# Patient Record
Sex: Female | Born: 1995 | Race: White | Hispanic: No | Marital: Single | Smoking: Former smoker
Health system: Southern US, Community
[De-identification: ages and names within clinical notes are randomized; demographics above are authoritative.]

## PROBLEM LIST (undated history)

## (undated) ENCOUNTER — Inpatient Hospital Stay (HOSPITAL_COMMUNITY): Payer: Self-pay

## (undated) DIAGNOSIS — G43909 Migraine, unspecified, not intractable, without status migrainosus: Secondary | ICD-10-CM

## (undated) DIAGNOSIS — D242 Benign neoplasm of left breast: Secondary | ICD-10-CM

## (undated) HISTORY — PX: OTHER SURGICAL HISTORY: SHX169

## (undated) HISTORY — PX: KNEE ARTHROSCOPY W/ ACL RECONSTRUCTION: SHX1858

---
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
## 2017-03-01 DIAGNOSIS — D242 Benign neoplasm of left breast: Secondary | ICD-10-CM

## 2017-03-01 HISTORY — DX: Benign neoplasm of left breast: D24.2

## 2017-03-21 ENCOUNTER — Other Ambulatory Visit: Payer: Self-pay | Admitting: General Surgery

## 2017-03-21 DIAGNOSIS — D242 Benign neoplasm of left breast: Secondary | ICD-10-CM | POA: Diagnosis not present

## 2017-03-28 ENCOUNTER — Encounter (HOSPITAL_BASED_OUTPATIENT_CLINIC_OR_DEPARTMENT_OTHER): Payer: Self-pay | Admitting: *Deleted

## 2017-03-28 ENCOUNTER — Other Ambulatory Visit: Payer: Self-pay

## 2017-04-03 NOTE — H&P (Signed)
Abigail Salas Location: [facility] Patient #: [id number] DOB: October 10, 1995 Single / Language: Cleophus Molt / Race: White Female        History of Present Illness  The patient is a 22 year old female who presents with a breast mass. This is a 22 year old female, referred by Linda Hedges, D[facility] and Nolon Nations at the [facility] for evaluation and management of a symptomatic fibroadenoma of the left breast. Her grandmother is with her throughout the encounter. She has noticed a palpable mass in her left breast one year. It waxes and wanes in size with her menstrual period. There is some tenderness. Ultrasound shows a 2.6 cm mass in the left breast upper inner quadrant, 11 o'clock position, 6 cm from the nipple consistent with a fibroadenoma. The patient desires excision. She does not want to be followed with serial ultrasounds.  Family history is significant for breast cancer in a maternal aunt, survivor. No other breast or ovarian cancer in the family Past history is negative. She is healthy. BMI 35 Social history she lives with her grandmother. 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 Denies alcohol or tobacco. Went to [employer]  She will be scheduled for excision of left breast fibroadenoma under general anesthesia with LMA in the near future. I discussed indications, details, techniques, and risk of the surgery with them both. She is aware the risks of bleeding, infection, cosmetic deformity, nerve damage with chronic pain or numbness. She knows there will be a scar. Rarely R was surprised to find cancer but she is aware that that is a tiny possibility. She understands all of these issues well. All of her questions were answered. She agrees with this plan   Allergies No Known Drug Allergies  Allergies Reconciled   Medication History  Skyla (13.5MG  IUD, Intrauterine) Active. Medications  Reconciled  Vitals  Weight: 181 lb (95th percentile) Height: 60in (5th percentile) Body Surface Area: 1.79 m Body Mass Index: 35.35 kg/m  (97th percentile)  Temp.: 98.30F  Pulse: 74 (Regular)  BP: 128/82 (Sitting, Left Arm, Standard)  Percentiles calculated using CDC data for children 2-20 years.     Physical Exam  General Mental Status-Alert. General Appearance-Consistent with stated age. Hydration-Well hydrated. Voice-Normal. Note: BMI 35   Head and Neck Head-normocephalic, atraumatic with no lesions or palpable masses. Trachea-midline. Thyroid Gland Characteristics - normal size and consistency.  Eye Eyeball - Bilateral-Extraocular movements intact. Sclera/Conjunctiva - Bilateral-No scleral icterus.  Chest and Lung Exam Chest and lung exam reveals -quiet, even and easy respiratory effort with no use of accessory muscles and on auscultation, normal breath sounds, no adventitious sounds and normal vocal resonance. Inspection Chest Wall - Normal. Back - normal.  Breast Note: Breast are large. Soft. No skin changes on either side. Palpable rubbery mobile slightly tender mass left breast, 11 o'clock position. About 5 cm outside the areolar margin. Texture very consistent with fibroadenoma. No other masses. No axillary adenopathy on either side.   Cardiovascular Cardiovascular examination reveals -normal heart sounds, regular rate and rhythm with no murmurs and normal pedal pulses bilaterally.  Abdomen Inspection Inspection of the abdomen reveals - No Hernias. Skin - Scar - no surgical scars. Palpation/Percussion Palpation and Percussion of the abdomen reveal - Soft, Non Tender, No Rebound tenderness, No Rigidity (guarding) and No hepatosplenomegaly. Auscultation Auscultation of the abdomen reveals - Bowel sounds normal.  Neurologic Neurologic evaluation reveals -alert and oriented x 3 with no  impairment of recent or  remote memory. Mental Status-Normal.  Musculoskeletal Normal Exam - Left-Upper Extremity Strength Normal and Lower Extremity Strength Normal. Normal Exam - Right-Upper Extremity Strength Normal and Lower Extremity Strength Normal.  Lymphatic Head & Neck  General Head & Neck Lymphatics: Bilateral - Description - Normal. Axillary  General Axillary Region: Bilateral - Description - Normal. Tenderness - Non Tender. Femoral & Inguinal  Generalized Femoral & Inguinal Lymphatics: Bilateral - Description - Normal. Tenderness - Non Tender.    Assessment & Plan  BREAST FIBROADENOMA, LEFT (D24.2)    You have a 2.6 cm mass in your left breast, upper inner quadrant Almost certainly this is a benign fibroadenoma you stated that you would like this removed which is reasonable considering its size and the pain you're having you'll be scheduled for excision of left breast fibroadenoma in the operating room in the near future We have discussed the indications, techniques, and risks of the surgery in detail  Pt Education - Pamphlet Given - Breast Biopsy: discussed with patient and provided information. BMI 35.0-35.9,ADULT (Z68.35)    Edsel Petrin. Dalbert Batman, M.D., [facility] General and Minimally invasive Surgery Breast and Colorectal Surgery Office:   [phone] Pager:   [phone]

## 2017-04-04 ENCOUNTER — Other Ambulatory Visit: Payer: Self-pay

## 2017-04-04 ENCOUNTER — Ambulatory Visit (HOSPITAL_BASED_OUTPATIENT_CLINIC_OR_DEPARTMENT_OTHER)
Admission: RE | Admit: 2017-04-04 | Discharge: 2017-04-04 | Disposition: A | Discharge status: Home / Self Care | Payer: Commercial Managed Care - HMO | Source: Ambulatory Visit | Attending: General Surgery | Admitting: General Surgery

## 2017-04-04 ENCOUNTER — Encounter (HOSPITAL_BASED_OUTPATIENT_CLINIC_OR_DEPARTMENT_OTHER): Payer: Self-pay

## 2017-04-04 ENCOUNTER — Ambulatory Visit (HOSPITAL_BASED_OUTPATIENT_CLINIC_OR_DEPARTMENT_OTHER): Payer: Commercial Managed Care - HMO | Admitting: Anesthesiology

## 2017-04-04 ENCOUNTER — Encounter (HOSPITAL_BASED_OUTPATIENT_CLINIC_OR_DEPARTMENT_OTHER)
Admission: RE | Disposition: A | Discharge status: Home / Self Care | Payer: Self-pay | Source: Ambulatory Visit | Attending: General Surgery

## 2017-04-04 DIAGNOSIS — Z803 Family history of malignant neoplasm of breast: Secondary | ICD-10-CM | POA: Insufficient documentation

## 2017-04-04 DIAGNOSIS — D242 Benign neoplasm of left breast: Secondary | ICD-10-CM | POA: Diagnosis not present

## 2017-04-04 HISTORY — DX: Migraine, unspecified, not intractable, without status migrainosus: G43.909

## 2017-04-04 HISTORY — DX: Benign neoplasm of left breast: D24.2

## 2017-04-04 HISTORY — PX: BREAST LUMPECTOMY: SHX2

## 2017-04-04 HISTORY — PX: BREAST EXCISIONAL BIOPSY: SUR124

## 2017-04-04 SURGERY — BREAST LUMPECTOMY
Anesthesia: General | Site: Breast | Laterality: Left

## 2017-04-04 MED ORDER — SCOPOLAMINE 1 MG/3DAYS TD PT72
1.0000 | MEDICATED_PATCH | Freq: Once | TRANSDERMAL | Status: DC | PRN
Start: 2017-04-04 — End: 2017-04-04

## 2017-04-04 MED ORDER — CEFAZOLIN SODIUM-DEXTROSE 2-4 GM/100ML-% IV SOLN
INTRAVENOUS | Status: AC
  Filled 2017-04-04: qty 100

## 2017-04-04 MED ORDER — LIDOCAINE 2% (20 MG/ML) 5 ML SYRINGE
INTRAMUSCULAR | Status: AC
  Filled 2017-04-04: qty 5

## 2017-04-04 MED ORDER — ONDANSETRON HCL 4 MG/2ML IJ SOLN
INTRAMUSCULAR | Status: AC
  Filled 2017-04-04: qty 2

## 2017-04-04 MED ORDER — FENTANYL CITRATE (PF) 100 MCG/2ML IJ SOLN
50.0000 ug | INTRAMUSCULAR | Status: DC | PRN
  Administered 2017-04-04: 100 ug via INTRAVENOUS

## 2017-04-04 MED ORDER — GABAPENTIN 300 MG PO CAPS
300.0000 mg | ORAL_CAPSULE | ORAL | Status: AC
  Administered 2017-04-04: 300 mg via ORAL

## 2017-04-04 MED ORDER — LACTATED RINGERS IV SOLN
INTRAVENOUS | Status: DC
  Administered 2017-04-04: 07:00:00 via INTRAVENOUS

## 2017-04-04 MED ORDER — CELECOXIB 200 MG PO CAPS
200.0000 mg | ORAL_CAPSULE | ORAL | Status: AC
  Administered 2017-04-04: 200 mg via ORAL

## 2017-04-04 MED ORDER — OXYCODONE HCL 5 MG/5ML PO SOLN: 5.0000 mg | Freq: Once | ORAL | Status: DC | PRN

## 2017-04-04 MED ORDER — DEXAMETHASONE SODIUM PHOSPHATE 4 MG/ML IJ SOLN
INTRAMUSCULAR | Status: DC | PRN
  Administered 2017-04-04: 10 mg via INTRAVENOUS

## 2017-04-04 MED ORDER — ONDANSETRON HCL 4 MG/2ML IJ SOLN: 4.0000 mg | Freq: Four times a day (QID) | INTRAMUSCULAR | Status: DC | PRN

## 2017-04-04 MED ORDER — CEFAZOLIN SODIUM-DEXTROSE 2-4 GM/100ML-% IV SOLN
2.0000 g | INTRAVENOUS | Status: AC
  Administered 2017-04-04: 2 g via INTRAVENOUS

## 2017-04-04 MED ORDER — BUPIVACAINE-EPINEPHRINE (PF) 0.5% -1:200000 IJ SOLN
INTRAMUSCULAR | Status: AC
Start: 2017-04-04 — End: 2017-04-04
  Filled 2017-04-04: qty 30

## 2017-04-04 MED ORDER — BUPIVACAINE-EPINEPHRINE 0.5% -1:200000 IJ SOLN
INTRAMUSCULAR | Status: DC | PRN
  Administered 2017-04-04: 10 mL

## 2017-04-04 MED ORDER — ACETAMINOPHEN 500 MG PO TABS
ORAL_TABLET | ORAL | Status: AC
Start: 2017-04-04 — End: 2017-04-04
  Filled 2017-04-04: qty 2

## 2017-04-04 MED ORDER — GABAPENTIN 300 MG PO CAPS
ORAL_CAPSULE | ORAL | Status: AC
  Filled 2017-04-04: qty 1

## 2017-04-04 MED ORDER — PROPOFOL 500 MG/50ML IV EMUL
INTRAVENOUS | Status: AC
  Filled 2017-04-04: qty 50

## 2017-04-04 MED ORDER — CHLORHEXIDINE GLUCONATE CLOTH 2 % EX PADS: 6.0000 | MEDICATED_PAD | Freq: Once | CUTANEOUS | Status: DC

## 2017-04-04 MED ORDER — ACETAMINOPHEN 500 MG PO TABS
1000.0000 mg | ORAL_TABLET | ORAL | Status: AC
  Administered 2017-04-04: 1000 mg via ORAL

## 2017-04-04 MED ORDER — PROPOFOL 10 MG/ML IV BOLUS
INTRAVENOUS | Status: DC | PRN
  Administered 2017-04-04: 30 mg via INTRAVENOUS
  Administered 2017-04-04: 200 mg via INTRAVENOUS
  Administered 2017-04-04: 20 mg via INTRAVENOUS

## 2017-04-04 MED ORDER — CELECOXIB 200 MG PO CAPS
ORAL_CAPSULE | ORAL | Status: AC
  Filled 2017-04-04: qty 1

## 2017-04-04 MED ORDER — LIDOCAINE HCL (CARDIAC) 20 MG/ML IV SOLN
INTRAVENOUS | Status: DC | PRN
  Administered 2017-04-04: 80 mg via INTRAVENOUS

## 2017-04-04 MED ORDER — FENTANYL CITRATE (PF) 100 MCG/2ML IJ SOLN
INTRAMUSCULAR | Status: AC
  Filled 2017-04-04: qty 2

## 2017-04-04 MED ORDER — MIDAZOLAM HCL 2 MG/2ML IJ SOLN
1.0000 mg | INTRAMUSCULAR | Status: DC | PRN
  Administered 2017-04-04: 2 mg via INTRAVENOUS

## 2017-04-04 MED ORDER — MIDAZOLAM HCL 2 MG/2ML IJ SOLN
INTRAMUSCULAR | Status: AC
  Filled 2017-04-04: qty 2

## 2017-04-04 MED ORDER — FENTANYL CITRATE (PF) 100 MCG/2ML IJ SOLN
25.0000 ug | INTRAMUSCULAR | Status: DC | PRN
  Administered 2017-04-04 (×2): 50 ug via INTRAVENOUS

## 2017-04-04 MED ORDER — OXYCODONE HCL 5 MG PO TABS: 5.0000 mg | ORAL_TABLET | Freq: Once | ORAL | Status: DC | PRN

## 2017-04-04 MED ORDER — DEXAMETHASONE SODIUM PHOSPHATE 10 MG/ML IJ SOLN
INTRAMUSCULAR | Status: AC
  Filled 2017-04-04: qty 1

## 2017-04-04 MED ORDER — ONDANSETRON HCL 4 MG/2ML IJ SOLN
INTRAMUSCULAR | Status: DC | PRN
  Administered 2017-04-04: 4 mg via INTRAVENOUS

## 2017-04-04 MED ORDER — HYDROCODONE-ACETAMINOPHEN 5-325 MG PO TABS: 1.0000 | ORAL_TABLET | Freq: Four times a day (QID) | ORAL | 0 refills | Status: DC | PRN

## 2017-04-04 SURGICAL SUPPLY — 39 items
BINDER BREAST XLRG (GAUZE/BANDAGES/DRESSINGS) ×2 IMPLANT
BLADE HEX COATED 2.75 (ELECTRODE) ×2 IMPLANT
BLADE SURG 15 STRL LF DISP TIS (BLADE) ×1 IMPLANT
BLADE SURG 15 STRL SS (BLADE) ×1
CANISTER SUCT 1200ML W/VALVE (MISCELLANEOUS) ×2 IMPLANT
CHLORAPREP W/TINT 26ML (MISCELLANEOUS) ×2 IMPLANT
COVER BACK TABLE 60X90IN (DRAPES) ×2 IMPLANT
COVER MAYO STAND STRL (DRAPES) ×2 IMPLANT
DERMABOND ADVANCED (GAUZE/BANDAGES/DRESSINGS) ×1
DERMABOND ADVANCED .7 DNX12 (GAUZE/BANDAGES/DRESSINGS) ×1 IMPLANT
DRAPE LAPAROTOMY 100X72 PEDS (DRAPES) ×2 IMPLANT
DRAPE UTILITY XL STRL (DRAPES) ×2 IMPLANT
DRSG PAD ABDOMINAL 8X10 ST (GAUZE/BANDAGES/DRESSINGS) ×2 IMPLANT
ELECT REM PT RETURN 9FT ADLT (ELECTROSURGICAL) ×2
ELECTRODE REM PT RTRN 9FT ADLT (ELECTROSURGICAL) ×1 IMPLANT
GAUZE SPONGE 4X4 12PLY STRL LF (GAUZE/BANDAGES/DRESSINGS) ×2 IMPLANT
GAUZE SPONGE 4X4 16PLY XRAY LF (GAUZE/BANDAGES/DRESSINGS) ×4 IMPLANT
GLOVE BIO SURGEON STRL SZ7 (GLOVE) ×2 IMPLANT
GLOVE BIOGEL PI IND STRL 7.0 (GLOVE) ×1 IMPLANT
GLOVE BIOGEL PI INDICATOR 7.0 (GLOVE) ×1
GLOVE EUDERMIC 7 POWDERFREE (GLOVE) ×2 IMPLANT
GOWN STRL REUS W/ TWL LRG LVL3 (GOWN DISPOSABLE) ×1 IMPLANT
GOWN STRL REUS W/ TWL XL LVL3 (GOWN DISPOSABLE) ×1 IMPLANT
GOWN STRL REUS W/TWL LRG LVL3 (GOWN DISPOSABLE) ×1
GOWN STRL REUS W/TWL XL LVL3 (GOWN DISPOSABLE) ×1
NEEDLE HYPO 25X1 1.5 SAFETY (NEEDLE) ×2 IMPLANT
NS IRRIG 1000ML POUR BTL (IV SOLUTION) ×2 IMPLANT
PACK BASIN DAY SURGERY FS (CUSTOM PROCEDURE TRAY) ×2 IMPLANT
PENCIL BUTTON HOLSTER BLD 10FT (ELECTRODE) ×2 IMPLANT
SLEEVE SCD COMPRESS KNEE MED (MISCELLANEOUS) ×2 IMPLANT
SPONGE LAP 4X18 X RAY DECT (DISPOSABLE) ×2 IMPLANT
SUT MNCRL AB 4-0 PS2 18 (SUTURE) ×2 IMPLANT
SUT SILK 2 0 SH (SUTURE) ×2 IMPLANT
SUT VICRYL 3-0 CR8 SH (SUTURE) ×2 IMPLANT
SYR 10ML LL (SYRINGE) ×2 IMPLANT
SYR BULB 3OZ (MISCELLANEOUS) ×2 IMPLANT
TOWEL OR NON WOVEN STRL DISP B (DISPOSABLE) ×2 IMPLANT
TUBE CONNECTING 20X1/4 (TUBING) ×2 IMPLANT
YANKAUER SUCT BULB TIP NO VENT (SUCTIONS) ×2 IMPLANT

## 2017-04-04 NOTE — Anesthesia Procedure Notes (Signed)
Procedure Name: LMA Insertion Date/Time: 04/04/2017 7:31 AM Performed by: Lyndee Leo, CRNA Pre-anesthesia Checklist: Patient identified, Emergency Drugs available, Suction available and Patient being monitored Patient Re-evaluated:Patient Re-evaluated prior to induction Oxygen Delivery Method: Circle system utilized Preoxygenation: Pre-oxygenation with 100% oxygen Induction Type: IV induction Ventilation: Mask ventilation without difficulty LMA: LMA inserted LMA Size: 4.0 Number of attempts: 1 Airway Equipment and Method: Bite block Placement Confirmation: positive ETCO2 Tube secured with: Tape Dental Injury: Teeth and Oropharynx as per pre-operative assessment

## 2017-04-04 NOTE — Interval H&P Note (Signed)
History and Physical Interval Note:  04/04/2017 6:16 AM  Abigail Salas  has presented today for surgery, with the diagnosis of LEFT BREAST FIBROADENOMA  The various methods of treatment have been discussed with the patient and family. After consideration of risks, benefits and other options for treatment, the patient has consented to  Procedure(s): EXCISION OF LEFT BREAST 2-6CM FIBROADENOMA (Left) as a surgical intervention .  The patient's history has been reviewed, patient examined, no change in status, stable for surgery.  I have reviewed the patient's chart and labs.  Questions were answered to the patient's satisfaction.     Adin Hector

## 2017-04-04 NOTE — Transfer of Care (Signed)
Immediate Anesthesia Transfer of Care Note  Patient: Abigail Salas  Procedure(s) Performed: EXCISION OF LEFT BREAST FIBROADENOMA (Left Breast)  Patient Location: PACU  Anesthesia Type:General  Level of Consciousness: awake, sedated and patient cooperative  Airway & Oxygen Therapy: Patient Spontanous Breathing and Patient connected to face mask oxygen  Post-op Assessment: Report given to RN and Post -op Vital signs reviewed and stable  Post vital signs: Reviewed and stable  Last Vitals:  Vitals:   04/04/17 0624  BP: 109/69  Pulse: (!) 59  Resp: 16  Temp: 36.6 C  SpO2: 99%    Last Pain:  Vitals:   04/04/17 0624  TempSrc: Oral         Complications: No apparent anesthesia complications

## 2017-04-04 NOTE — Anesthesia Preprocedure Evaluation (Signed)
Anesthesia Evaluation  Patient identified by MRN, date of birth, ID band Patient awake    Reviewed: Allergy & Precautions, H&P , NPO status , Patient's Chart, lab work & pertinent test results  Airway Mallampati: II   Neck ROM: full    Dental   Pulmonary neg pulmonary ROS,    breath sounds clear to auscultation       Cardiovascular negative cardio ROS   Rhythm:regular Rate:Normal     Neuro/Psych  Headaches,    GI/Hepatic   Endo/Other  obese  Renal/GU      Musculoskeletal   Abdominal   Peds  Hematology   Anesthesia Other Findings   Reproductive/Obstetrics                             Anesthesia Physical Anesthesia Plan  ASA: II  Anesthesia Plan: General   Post-op Pain Management:    Induction: Intravenous  PONV Risk Score and Plan: 3 and Ondansetron, Dexamethasone, Midazolam and Treatment may vary due to age or medical condition  Airway Management Planned: LMA  Additional Equipment:   Intra-op Plan:   Post-operative Plan:   Informed Consent: I have reviewed the patients History and Physical, chart, labs and discussed the procedure including the risks, benefits and alternatives for the proposed anesthesia with the patient or authorized representative who has indicated his/her understanding and acceptance.     Plan Discussed with: CRNA, Anesthesiologist and Surgeon  Anesthesia Plan Comments:         Anesthesia Quick Evaluation

## 2017-04-04 NOTE — Op Note (Signed)
  Patient Name:           Abigail Salas   Date of Surgery:        04/04/2017  Pre op Diagnosis:      Fibroadenoma left breast  Post op Diagnosis:    Same  Procedure:                 Excision fibroadenoma left breast  Surgeon:                     Edsel Petrin. Dalbert Batman, M.D., FACS  Assistant:                      None  Operative Indications:    This is a 22 year old female, referred by [facility] and Nolon Nations at the [facility] for evaluation and management of a symptomatic fibroadenoma of the left breast.  She has noticed a palpable mass in her left breast one year. It waxes and wanes in size with her menstrual period. There is some tenderness. Ultrasound shows a 2.6 cm mass in the left breast upper inner quadrant, 11 o'clock position, 6 cm from the nipple consistent with a fibroadenoma. The patient desires excision. She does not want to be followed with serial ultrasounds. Family history is significant for breast cancer in a maternal aunt, survivor. No other breast or ovarian cancer in the family She will be scheduled for excision of left breast fibroadenoma under general anesthesia with LMA in the near future.. She agrees with this plan    Operative Findings:       In the left breast, 11:00 position, about 6 cm above the areolar margin there was a 2.5 cm whitish, rubbery, well marginated, slightly lobulated mass consistent with a fibroadenoma.  This was completely excised.  Procedure in Detail:          Following the induction of general LMA anesthesia the patient's left breast was prepped and draped in a sterile fashion.  Surgical timeout was performed.  Intravenous antibiotics were given.  0.5% Marcaine with epinephrine was used as local infiltration anesthetic.  A small curvilinear incision was made overlying the palpable mass.  Using electrocautery and sharp dissection the mass was completely excised and then sent to pathology.  Hemostasis was very good  achieved with electrocautery.  The wound was irrigated.  The breast tissues were reapproximated with 3-0 Vicryl sutures and the skin closed with a running subcuticular 4-0 Monocryl and Dermabond.  A bandage and breast binder were placed.  The patient tolerated the procedure well was taken to PACU in stable condition.  EBL 20 mL or less.  Counts correct.  Complications none.     Edsel Petrin. Dalbert Batman, M.D., FACS General and Minimally Invasive Surgery Breast and Colorectal Surgery   Addendum: I logged onto the [facility] website and reviewed her prescription medication history  04/04/2017 8:20 AM

## 2017-04-04 NOTE — Discharge Instructions (Signed)
[facility] °Office Phone Number [phone] ° °BREAST BIOPSY/ PARTIAL MASTECTOMY: POST OP INSTRUCTIONS ° °Always review your discharge instruction sheet given to you by the facility where your surgery was performed. ° °IF YOU HAVE DISABILITY OR FAMILY LEAVE FORMS, YOU MUST BRING THEM TO THE OFFICE FOR PROCESSING.  DO NOT GIVE THEM TO YOUR DOCTOR. ° °1. A prescription for pain medication may be given to you upon discharge.  Take your pain medication as prescribed, if needed.  If narcotic pain medicine is not needed, then you may take acetaminophen (Tylenol) or ibuprofen (Advil) as needed. °2. Take your usually prescribed medications unless otherwise directed °3. If you need a refill on your pain medication, please contact your pharmacy.  They will contact our office to request authorization.  Prescriptions will not be filled after 5pm or on week-ends. °4. You should eat very light the first 24 hours after surgery, such as soup, crackers, pudding, etc.  Resume your normal diet the day after surgery. °5. Most patients will experience some swelling and bruising in the breast.  Ice packs and a good support bra will help.  Swelling and bruising can take several days to resolve.  °6. It is common to experience some constipation if taking pain medication after surgery.  Increasing fluid intake and taking a stool softener will usually help or prevent this problem from occurring.  A mild laxative (Milk of Magnesia or Miralax) should be taken according to package directions if there are no bowel movements after 48 hours. °7. Unless discharge instructions indicate otherwise, you may remove your bandages 24-48 hours after surgery, and you may shower at that time.  You may have steri-strips (small skin tapes) in place directly over the incision.  These strips should be left on the skin for 7-10 days.  If your surgeon used skin glue on the incision, you may shower in 24 hours.  The glue will flake off over the  next 2-3 weeks.  Any sutures or staples will be removed at the office during your follow-up visit. °8. ACTIVITIES:  You may resume regular daily activities (gradually increasing) beginning the next day.  Wearing a good support bra or sports bra minimizes pain and swelling.  You may have sexual intercourse when it is comfortable. °a. You may drive when you no longer are taking prescription pain medication, you can comfortably wear a seatbelt, and you can safely maneuver your car and apply brakes. °b. RETURN TO WORK:  ______________________________________________________________________________________ °9. You should see your doctor in the office for a follow-up appointment approximately two weeks after your surgery.  Your doctor’s nurse will typically make your follow-up appointment when she calls you with your pathology report.  Expect your pathology report 2-3 business days after your surgery.  You may call to check if you do not hear from us after three days. °10. OTHER INSTRUCTIONS: _______________________________________________________________________________________________ _____________________________________________________________________________________________________________________________________ °_____________________________________________________________________________________________________________________________________ °_____________________________________________________________________________________________________________________________________ ° °WHEN TO CALL YOUR DOCTOR: °1. Fever over 101.0 °2. Nausea and/or vomiting. °3. Extreme swelling or bruising. °4. Continued bleeding from incision. °5. Increased pain, redness, or drainage from the incision. ° °The clinic staff is available to answer your questions during regular business hours.  Please don’t hesitate to call and ask to speak to one of the nurses for clinical concerns.  If you have a medical emergency, go to the nearest  emergency room or call 911.  A surgeon from [facility] is always on call at the hospital. ° °For further questions, please visit [email or web address]  ° ° ° ° °  Post Anesthesia Home Care Instructions ° °Activity: °Get plenty of rest for the remainder of the day. A responsible individual must stay with you for 24 hours following the procedure.  °For the next 24 hours, DO NOT: °-Drive a car °-Operate machinery °-Drink alcoholic beverages °-Take any medication unless instructed by your physician °-Make any legal decisions or sign important papers. ° °Meals: °Start with liquid foods such as gelatin or soup. Progress to regular foods as tolerated. Avoid greasy, spicy, heavy foods. If nausea and/or vomiting occur, drink only clear liquids until the nausea and/or vomiting subsides. Call your physician if vomiting continues. ° °Special Instructions/Symptoms: °Your throat may feel dry or sore from the anesthesia or the breathing tube placed in your throat during surgery. If this causes discomfort, gargle with warm salt water. The discomfort should disappear within 24 hours. ° °If you had a scopolamine patch placed behind your ear for the management of post- operative nausea and/or vomiting: ° °1. The medication in the patch is effective for 72 hours, after which it should be removed.  Wrap patch in a tissue and discard in the trash. Wash hands thoroughly with soap and water. °2. You may remove the patch earlier than 72 hours if you experience unpleasant side effects which may include dry mouth, dizziness or visual disturbances. °3. Avoid touching the patch. Wash your hands with soap and water after contact with the patch. °  ° °

## 2017-04-04 NOTE — Anesthesia Postprocedure Evaluation (Signed)
Anesthesia Post Note  Patient: Abigail Salas  Procedure(s) Performed: EXCISION OF LEFT BREAST FIBROADENOMA (Left Breast)     Patient location during evaluation: PACU Anesthesia Type: General Level of consciousness: awake and alert Pain management: pain level controlled Vital Signs Assessment: post-procedure vital signs reviewed and stable Respiratory status: spontaneous breathing, nonlabored ventilation, respiratory function stable and patient connected to nasal cannula oxygen Cardiovascular status: blood pressure returned to baseline and stable Postop Assessment: no apparent nausea or vomiting Anesthetic complications: no    Last Vitals:  Vitals:   04/04/17 0915 04/04/17 0930  BP: 99/76 (!) 109/50  Pulse: 61 64  Resp: 17 18  Temp:  36.8 C  SpO2: 97% 99%    Last Pain:  Vitals:   04/04/17 0930  TempSrc:   PainSc: [place]

## 2017-04-05 NOTE — Progress Notes (Signed)
Inform patient of Pathology report,.tell her that the final breast pathology shows a benign fibroadenoma, as expected.  There is no evidence of cancer. Let me know that you reached her.  hmi

## 2017-04-06 ENCOUNTER — Encounter (HOSPITAL_BASED_OUTPATIENT_CLINIC_OR_DEPARTMENT_OTHER): Payer: Self-pay | Admitting: General Surgery

## 2017-06-01 DIAGNOSIS — Z23 Encounter for immunization: Secondary | ICD-10-CM | POA: Diagnosis not present

## 2017-06-01 DIAGNOSIS — Z8042 Family history of malignant neoplasm of prostate: Secondary | ICD-10-CM | POA: Diagnosis not present

## 2017-06-01 DIAGNOSIS — Z803 Family history of malignant neoplasm of breast: Secondary | ICD-10-CM | POA: Diagnosis not present

## 2017-06-01 DIAGNOSIS — Z01419 Encounter for gynecological examination (general) (routine) without abnormal findings: Secondary | ICD-10-CM | POA: Diagnosis not present

## 2017-06-01 DIAGNOSIS — Z809 Family history of malignant neoplasm, unspecified: Secondary | ICD-10-CM | POA: Diagnosis not present

## 2017-08-01 DIAGNOSIS — Z23 Encounter for immunization: Secondary | ICD-10-CM | POA: Diagnosis not present

## 2017-08-01 DIAGNOSIS — Z809 Family history of malignant neoplasm, unspecified: Secondary | ICD-10-CM | POA: Diagnosis not present

## 2017-08-24 DIAGNOSIS — N938 Other specified abnormal uterine and vaginal bleeding: Secondary | ICD-10-CM | POA: Diagnosis not present

## 2017-09-07 DIAGNOSIS — Z79899 Other long term (current) drug therapy: Secondary | ICD-10-CM | POA: Diagnosis not present

## 2017-09-07 DIAGNOSIS — G43519 Persistent migraine aura without cerebral infarction, intractable, without status migrainosus: Secondary | ICD-10-CM | POA: Diagnosis not present

## 2017-09-08 DIAGNOSIS — N911 Secondary amenorrhea: Secondary | ICD-10-CM | POA: Diagnosis not present

## 2017-09-09 DIAGNOSIS — N912 Amenorrhea, unspecified: Secondary | ICD-10-CM | POA: Diagnosis not present

## 2017-09-11 ENCOUNTER — Encounter (HOSPITAL_COMMUNITY): Payer: Self-pay

## 2017-09-11 ENCOUNTER — Inpatient Hospital Stay (HOSPITAL_COMMUNITY)
Admission: AD | Admit: 2017-09-11 | Discharge: 2017-09-11 | Disposition: A | Discharge status: Home / Self Care | Payer: 59 | Source: Ambulatory Visit | Attending: Obstetrics and Gynecology | Admitting: Obstetrics and Gynecology

## 2017-09-11 ENCOUNTER — Inpatient Hospital Stay (HOSPITAL_COMMUNITY): Payer: 59

## 2017-09-11 DIAGNOSIS — Z3A09 9 weeks gestation of pregnancy: Secondary | ICD-10-CM | POA: Diagnosis not present

## 2017-09-11 DIAGNOSIS — N83202 Unspecified ovarian cyst, left side: Secondary | ICD-10-CM | POA: Diagnosis not present

## 2017-09-11 DIAGNOSIS — O3481 Maternal care for other abnormalities of pelvic organs, first trimester: Secondary | ICD-10-CM | POA: Diagnosis not present

## 2017-09-11 DIAGNOSIS — O3680X0 Pregnancy with inconclusive fetal viability, not applicable or unspecified: Secondary | ICD-10-CM

## 2017-09-11 DIAGNOSIS — Z3A00 Weeks of gestation of pregnancy not specified: Secondary | ICD-10-CM | POA: Diagnosis not present

## 2017-09-11 DIAGNOSIS — Z87891 Personal history of nicotine dependence: Secondary | ICD-10-CM | POA: Insufficient documentation

## 2017-09-11 DIAGNOSIS — O209 Hemorrhage in early pregnancy, unspecified: Secondary | ICD-10-CM | POA: Insufficient documentation

## 2017-09-11 LAB — URINALYSIS, ROUTINE W REFLEX MICROSCOPIC
Bilirubin Urine: NEGATIVE
Glucose, UA: NEGATIVE mg/dL
Ketones, ur: NEGATIVE mg/dL
Leukocytes, UA: NEGATIVE
Nitrite: NEGATIVE
Protein, ur: 30 mg/dL — AB
RBC / HPF: >50 RBC/hpf — ABNORMAL HIGH (ref 0–5)
Specific Gravity, Urine: 1.024 (ref 1.005–1.030)
pH: 8 (ref 5.0–8.0)

## 2017-09-11 LAB — WET PREP, GENITAL
Clue Cells Wet Prep HPF POC: NONE SEEN
Sperm: NONE SEEN
Trich, Wet Prep: NONE SEEN
Yeast Wet Prep HPF POC: NONE SEEN

## 2017-09-11 LAB — CBC
HCT: 43.3 % (ref 36.0–46.0)
Hemoglobin: 14.9 g/dL (ref 12.0–15.0)
MCH: 29.8 pg (ref 26.0–34.0)
MCHC: 34.4 g/dL (ref 30.0–36.0)
MCV: 86.6 fL (ref 78.0–100.0)
Platelets: 282 10*3/uL (ref 150–400)
RBC: 5 MIL/uL (ref 3.87–5.11)
RDW: 12.7 % (ref 11.5–15.5)
WBC: 7.6 10*3/uL (ref 4.0–10.5)

## 2017-09-11 LAB — HCG, QUANTITATIVE, PREGNANCY: hCG, Beta Chain, Quant, S: 42 m[IU]/mL — ABNORMAL HIGH (ref <5)

## 2017-09-11 LAB — POCT PREGNANCY, URINE: Preg Test, Ur: POSITIVE — AB

## 2017-09-11 LAB — ABO/RH: ABO/RH(D): O POS

## 2017-09-11 NOTE — MAU Provider Note (Signed)
Chief Complaint: Vaginal Bleeding   First Provider Initiated Contact with Patient 09/11/17 1704      SUBJECTIVE HPI: Abigail Salas is a 22 y.o. G1P0 at [redacted]w[redacted]d by LMP who presents to [facility] reporting vaginal bleeding and left groin pain when she leans forward. None now.   Was Dx'd w/ pregnancy last week w. IUD in place (since March 2018). Had blood work and [place] in the office as follows: Quant 7/10: 56 [place]: IUD in normal position, no GS. Left 3.5 cm simple cysts. Right 1 cm ? Dermoid.  Quant 7/12: 40  IUD removed 7/11  Vaginal Bleeding: Since Friday evening 09/09/17 that has increased--Now moderate Passage of tissue or clots: Denies Dizziness: Denies  O POS Performed at [facility], [address], [place], [place] [address]   Pain Location: Left groin Quality: sharp Severity: Mod Duration: ~1 week Course: unchanged Context: Pos UPT w/ IUD in place (has been removed) Timing: intermittent Modifying factors: Resolves w/ position change Associated signs and symptoms: Pos for VB. Otherwise neg.  Past Medical History:  Diagnosis Date  . 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 Migraines    OB History  Gravida Para Term Preterm AB Living  1            SAB TAB Ectopic Multiple Live Births               # Outcome Date GA Lbr Len/2nd Weight Sex Delivery Anes PTL Lv  1 Current            Past Surgical History:  Procedure Laterality Date  . BREAST LUMPECTOMY Left 04/04/2017   Procedure: EXCISION OF LEFT BREAST FIBROADENOMA;  Surgeon: Fanny Skates, MD;  Location: [place];  Service: General;  Laterality: Left;  . KNEE ARTHROSCOPY W/ ACL RECONSTRUCTION Right    Social History   Socioeconomic History  . Marital status: Single    Spouse name: Not on file  . Number of children: Not on file  . Years of education: Not on file  . Highest education level: Not on file  Occupational History  . Not on file   Social Needs  . Financial resource strain: Not on file  . Food insecurity:    Worry: Not on file    Inability: Not on file  . Transportation needs:    Medical: Not on file    Non-medical: Not on file  Tobacco Use  . Smoking status: Former Research scientist (life sciences)  . Smokeless tobacco: Never Used  Substance and Sexual Activity  . Alcohol use: No    Frequency: Never  . Drug use: No  . Sexual activity: Not on file  Lifestyle  . Physical activity:    Days per week: Not on file    Minutes per session: Not on file  . Stress: Not on file  Relationships  . Social connections:    Talks on phone: Not on file    Gets together: Not on file    Attends religious service: Not on file    Active member of club or organization: Not on file    Attends meetings of clubs or organizations: Not on file    Relationship status: Not on file  . Intimate partner violence:    Fear of current or ex partner: Not on file    Emotionally abused: Not on file    Physically abused: Not on file    Forced sexual activity: Not on file  Other Topics Concern  . Not on file  Social History Narrative  . Not on file   No current facility-administered medications on file prior to encounter.    Current Outpatient Medications on File Prior to Encounter  Medication Sig Dispense Refill  . BIOTIN PO Take by mouth.    Marland Kitchen HYDROcodone-acetaminophen (NORCO) 5-325 MG tablet Take 1-2 tablets by mouth every 6 (six) hours as needed for moderate pain or severe pain. 20 tablet 0   No Known Allergies  I have reviewed the past Medical Hx, Surgical Hx, Social Hx, Allergies and Medications.   Review of Systems  OBJECTIVE Patient Vitals for the past 24 hrs:  BP Temp Temp src Pulse Resp Weight  09/11/17 1450 127/73 98 F (36.7 C) Oral 78 18 187 lb (84.8 kg)   Constitutional: Well-developed, well-nourished female in no acute distress.  Cardiovascular: normal rate Respiratory: normal rate and effort.  GI: Abd soft, non-tender.  MS:  Extremities nontender, no edema, normal ROM Neurologic: Alert and oriented x 4.  GU:  SPECULUM EXAM: NEFG, physiologic discharge, small-mod amount of bright blood noted, cervix clean  BIMANUAL: cervix closed; uterus normal size, no adnexal tenderness or masses. No CMT.  LAB RESULTS Results for orders placed or performed during the hospital encounter of 09/11/17 (from the past 24 hour(s))  Urinalysis, Routine w reflex microscopic     Status: Abnormal   Collection Time: 09/11/17  3:03 PM  Result Value Ref Range   Color, Urine YELLOW YELLOW   APPearance CLOUDY (A) CLEAR   Specific Gravity, Urine 1.024 1.005 - 1.030   pH 8.0 5.0 - 8.0   Glucose, UA NEGATIVE NEGATIVE mg/dL   Hgb urine dipstick LARGE (A) NEGATIVE   Bilirubin Urine NEGATIVE NEGATIVE   Ketones, ur NEGATIVE NEGATIVE mg/dL   Protein, ur 30 (A) NEGATIVE mg/dL   Nitrite NEGATIVE NEGATIVE   Leukocytes, UA NEGATIVE NEGATIVE   RBC / HPF >50 (H) 0 - 5 RBC/hpf   WBC, UA 0-5 0 - 5 WBC/hpf   Bacteria, UA RARE (A) NONE SEEN   Squamous Epithelial / LPF 0-5 0 - 5  Pregnancy, urine POC     Status: Abnormal   Collection Time: 09/11/17  3:38 PM  Result Value Ref Range   Preg Test, Ur POSITIVE (A) NEGATIVE  hCG, quantitative, pregnancy     Status: Abnormal   Collection Time: 09/11/17  3:58 PM  Result Value Ref Range   hCG, Beta Chain, Quant, S 42 (H) <5 mIU/mL  CBC     Status: None   Collection Time: 09/11/17  3:58 PM  Result Value Ref Range   WBC 7.6 4.0 - 10.5 K/uL   RBC 5.00 3.87 - 5.11 MIL/uL   Hemoglobin 14.9 12.0 - 15.0 g/dL   HCT 43.3 36.0 - 46.0 %   MCV 86.6 78.0 - 100.0 fL   MCH 29.8 26.0 - 34.0 pg   MCHC 34.4 30.0 - 36.0 g/dL   RDW 12.7 11.5 - 15.5 %   Platelets 282 150 - 400 K/uL  ABO/Rh     Status: None (Preliminary result)   Collection Time: 09/11/17  3:58 PM  Result Value Ref Range   ABO/RH(D)      O POS Performed at [facility], [address] [place], [place] [address]   Wet prep, genital     Status:  Abnormal   Collection Time: 09/11/17  5:19 PM  Result Value Ref Range   Yeast Wet Prep HPF POC NONE SEEN  NONE SEEN   Trich, Wet Prep NONE SEEN NONE SEEN   Clue Cells Wet Prep HPF POC NONE SEEN NONE SEEN   WBC, Wet Prep HPF POC FEW (A) NONE SEEN   Sperm NONE SEEN     IMAGING US Ob Comp Less 14 Wks  Result Date: 09/11/2017 CLINICAL DATA:  22 y/o F; vaginal bleeding. Recent removal of IUD with pregnancy. Beta hCG pending. EXAM: OBSTETRIC <14 WK [place] AND TRANSVAGINAL OB US TECHNIQUE: Both transabdominal and transvaginal ultrasound examinations were performed for complete evaluation of the gestation as well as the maternal uterus, adnexal regions, and pelvic cul-de-sac. Transvaginal technique was performed to assess early pregnancy. COMPARISON:  06/12/2010 pelvic ultrasound FINDINGS: Intrauterine gestational sac: None Yolk sac:  Not Visualized. Embryo:  Not Visualized. Cardiac Activity: Not Visualized. Subchorionic hemorrhage:  None visualized. Maternal uterus/adnexae: Simple left ovary cyst measuring up to 4 cm. Thickened endometrium to 15 mm. IMPRESSION: No pregnancy identified. If beta hCG is positive than this would represent a pregnancy of unknown location or spontaneous abortion. Intrauterine pregnancy may not be identified with a beta HCG of less than 3,000. Follow-up beta HCG and possible pelvic ultrasound may be indicated to evaluate pregnancy location. This recommendation follows SRU consensus guidelines: Diagnostic Criteria for Nonviable Pregnancy Early in the First Trimester. [place] Med 2013; 425:9563-87. Electronically Signed   By: Kristine Garbe M.D.   On: 09/11/2017 16:53   US Ob Transvaginal  Result Date: 09/11/2017 CLINICAL DATA:  22 y/o F; vaginal bleeding. Recent removal of IUD with pregnancy. Beta hCG pending. EXAM: OBSTETRIC <14 WK [place] AND TRANSVAGINAL OB US TECHNIQUE: Both transabdominal and transvaginal ultrasound examinations were performed for complete evaluation of the  gestation as well as the maternal uterus, adnexal regions, and pelvic cul-de-sac. Transvaginal technique was performed to assess early pregnancy. COMPARISON:  06/12/2010 pelvic ultrasound FINDINGS: Intrauterine gestational sac: None Yolk sac:  Not Visualized. Embryo:  Not Visualized. Cardiac Activity: Not Visualized. Subchorionic hemorrhage:  None visualized. Maternal uterus/adnexae: Simple left ovary cyst measuring up to 4 cm. Thickened endometrium to 15 mm. IMPRESSION: No pregnancy identified. If beta hCG is positive than this would represent a pregnancy of unknown location or spontaneous abortion. Intrauterine pregnancy may not be identified with a beta HCG of less than 3,000. Follow-up beta HCG and possible pelvic ultrasound may be indicated to evaluate pregnancy location. This recommendation follows SRU consensus guidelines: Diagnostic Criteria for Nonviable Pregnancy Early in the First Trimester. [place] [employer] Med 2013; 564:3329-51. Electronically Signed   By: Kristine Garbe M.D.   On: 09/11/2017 16:53    MAU COURSE CBC, Quant, ABO/Rh, ultrasound, wet prep and GC/chlamydia culture, UA  Discussed Hx, labs, exam w/ Dr. Royston Sinner. Reviewed office labs and [place]. Agrees w/ POC. New orders: F/U quant in office tomorrow morning. Counseled pt that if quant plateaus Dr. Royston Sinner would recommend MTX for presumed ectopic pregnancy.   Explained to pt and family that although MAU hCG cannot be compared to the lab used by [facility] that drop in quants from 7/10 and 7/12 at [facility] are C/W failed pregnancy. Unknown whether it is SAB or ectopic.   MDM Pain and bleeding in early pregnancy with pregnancy of unknown anatomic location, but hemodynamically stable.  ASSESSMENT 1. Pregnancy of unknown anatomic location   2. Vaginal bleeding in pregnancy, first trimester     PLAN Discharge home in stable condition. Ectopic precautions Support given. Follow-up [place],  [facility] Follow up on 09/12/2017.   Why:  For repeat hCG level Contact information: [place] [address] [phone]        [place] Follow up.   Why:  As needed in pregnancy emergencies Contact information: [address] [id number] [place] [phone]         Allergies as of 09/11/2017   No Known Allergies     Medication List    STOP taking these medications   HYDROcodone-acetaminophen 5-325 MG tablet Commonly known as:  NORCO     TAKE these medications   BIOTIN PO Take by mouth.        Tamala Julian, [place], [place] 09/11/2017  5:47 PM  4

## 2017-09-11 NOTE — Discharge Instructions (Signed)
Vaginal Bleeding During Pregnancy, First Trimester A small amount of bleeding (spotting) from the vagina is common in early pregnancy. Sometimes the bleeding is normal and is not a problem, and sometimes it is a sign of something serious. Be sure to tell your doctor about any bleeding from your vagina right away. Follow these instructions at home:  Watch your condition for any changes.  Follow your doctor's instructions about how active you can be.  If you are on bed rest: ? You may need to stay in bed and only get up to use the bathroom. ? You may be allowed to do some activities. ? If you need help, make plans for someone to help you.  Write down: ? The number of pads you use each day. ? How often you change pads. ? How soaked (saturated) your pads are.  Do not use tampons.  Do not douche.  Do not have sex or orgasms until your doctor says it is okay.  If you pass any tissue from your vagina, save the tissue so you can show it to your doctor.  Only take medicines as told by your doctor.  Do not take aspirin because it can make you bleed.  Keep all follow-up visits as told by your doctor. Contact a doctor if:  You bleed from your vagina.  You have cramps.  You have labor pains.  You have a fever that does not go away after you take medicine. Get help right away if:  You have very bad cramps in your back or belly (abdomen).  You pass large clots or tissue from your vagina.  You bleed more.  You feel light-headed or weak.  You pass out (faint).  You have chills.  You are leaking fluid or have a gush of fluid from your vagina.  You pass out while pooping (having a bowel movement). This information is not intended to replace advice given to you by your health care provider. Make sure you discuss any questions you have with your health care provider. Document Released: 07/02/2013 Document Revised: 07/24/2015 Document Reviewed: 10/23/2012 Elsevier Interactive  Patient Education  2018 [employer].   Ectopic Pregnancy An ectopic pregnancy is when the fertilized egg attaches (implants) outside the uterus. Most ectopic pregnancies occur in one of the tubes where eggs travel from the ovary to the uterus (fallopian tubes), but the implanting can occur in other locations. In rare cases, ectopic pregnancies occur on the ovary, intestine, pelvis, abdomen, or cervix. In an ectopic pregnancy, the fertilized egg does not have the ability to develop into a normal, healthy baby. A ruptured ectopic pregnancy is one in which tearing or bursting of a fallopian tube causes internal bleeding. Often, there is intense lower abdominal pain, and vaginal bleeding sometimes occurs. Having an ectopic pregnancy can be life-threatening. If this dangerous condition is not treated, it can lead to blood loss, shock, or even death. What are the causes? The most common cause of this condition is damage to one of the fallopian tubes. A fallopian tube may be narrowed or blocked, and that keeps the fertilized egg from reaching the uterus. What increases the risk? This condition is more likely to develop in women of childbearing age who have different levels of risk. The levels of risk can be divided into three categories. High risk  You have gone through infertility treatment.  You have had an ectopic pregnancy before.  You have had surgery on the fallopian tubes, or another surgical procedure, such  as an abortion.  You have had surgery to have the fallopian tubes tied (tubal ligation).  You have problems or diseases of the fallopian tubes.  You have been exposed to diethylstilbestrol (DES). This medicine was used until 1971, and it had effects on babies whose mothers took the medicine.  You become pregnant while using an IUD (intrauterine device) for birth control. Moderate risk  You have a history of infertility.  You have had an STI (sexually transmitted infection).  You  have a history of pelvic inflammatory disease (PID).  You have scarring from endometriosis.  You have multiple sexual partners.  You smoke. Low risk  You have had pelvic surgery.  You use vaginal douches.  You became sexually active before age 71. What are the signs or symptoms? Common symptoms of this condition include normal pregnancy symptoms, such as missing a period, nausea, tiredness, abdominal pain, breast tenderness, and bleeding. However, ectopic pregnancy will have additional symptoms, such as:  Pain with intercourse.  Irregular vaginal bleeding or spotting.  Cramping or pain on one side or in the lower abdomen.  Fast heartbeat, low blood pressure, and sweating.  Passing out while having a bowel movement.  Symptoms of a ruptured ectopic pregnancy and internal bleeding may include:  Sudden, severe pain in the abdomen and pelvis.  Dizziness, weakness, light-headedness, or fainting.  Pain in the shoulder or neck area.  How is this diagnosed? This condition is diagnosed by:  A pelvic exam to locate pain or a mass in the abdomen.  A pregnancy test. This blood test checks for the presence as well as the specific level of pregnancy hormone in the bloodstream.  Ultrasound. This is performed if a pregnancy test is positive. In this test, a probe is inserted into the vagina. The probe will detect a fetus, possibly in a location other than the uterus.  Taking a sample of uterus tissue (dilation and curettage, or D&C).  Surgery to perform a visual exam of the inside of the abdomen using a thin, lighted tube that has a tiny camera on the end (laparoscope).  Culdocentesis. This procedure involves inserting a needle at the top of the vagina, behind the uterus. If blood is present in this area, it may indicate that a fallopian tube is torn.  How is this treated? This condition is treated with medicine or surgery. Medicine  An injection of a medicine (methotrexate) may  be given to cause the pregnancy tissue to be absorbed. This medicine may save your fallopian tube. It may be given if: ? The diagnosis is made early, with no signs of active bleeding. ? The fallopian tube has not ruptured. ? You are considered to be a good candidate for the medicine. Usually, pregnancy hormone blood levels are checked after methotrexate treatment. This is to be sure that the medicine is effective. It may take 4-6 weeks for the pregnancy to be absorbed. Most pregnancies will be absorbed by 3 weeks. Surgery  A laparoscope may be used to remove the pregnancy tissue.  If severe internal bleeding occurs, a larger cut (incision) may be made in the lower abdomen (laparotomy) to remove the fetus and placenta. This is done to stop the bleeding.  Part or all of the fallopian tube may be removed (salpingectomy) along with the fetus and placenta. The fallopian tube may also be repaired during the surgery.  In very rare circumstances, removal of the uterus (hysterectomy) may be required.  After surgery, pregnancy hormone testing may be done  to be sure that there is no pregnancy tissue left. Whether your treatment is medicine or surgery, you may receive a Rho (D) immune globulin shot to prevent problems with any future pregnancy. This shot may be given if:  You are Rh-negative and the baby's father is Rh-positive.  You are Rh-negative and you do not know the Rh type of the baby's father.  Follow these instructions at home:  Rest and limit your activity after the procedure for as long as told by your health care provider.  Until your health care provider says that it is safe: ? Do not lift anything that is heavier than 10 lb (4.5 kg), or the limit that your health care provider tells you. ? Avoid physical exercise and any movement that requires effort (is strenuous).  To help prevent constipation: ? Eat a healthy diet that includes fruits, vegetables, and whole grains. ? Drink 6-8  glasses of water per day. Get help right away if:  You develop worsening pain that is not relieved by medicine.  You have: ? A fever or chills. ? Vaginal bleeding. ? Redness and swelling at the incision site. ? Nausea and vomiting.  You feel dizzy or weak.  You feel light-headed or you faint. This information is not intended to replace advice given to you by your health care provider. Make sure you discuss any questions you have with your health care provider. Document Released: 03/25/2004 Document Revised: 10/15/2015 Document Reviewed: 09/17/2015 Elsevier Interactive Patient Education  [employer].

## 2017-09-11 NOTE — MAU Note (Signed)
+   pregnancy test on Wednesday, cramping sometimes today, spotting on Friday and it's been getting heavier  Hcg 58 on Wednesday   IUD removed Thursday afternoon

## 2017-09-12 DIAGNOSIS — Z3009 Encounter for other general counseling and advice on contraception: Secondary | ICD-10-CM | POA: Diagnosis not present

## 2017-09-12 DIAGNOSIS — O3680X0 Pregnancy with inconclusive fetal viability, not applicable or unspecified: Secondary | ICD-10-CM | POA: Diagnosis not present

## 2017-09-12 DIAGNOSIS — N912 Amenorrhea, unspecified: Secondary | ICD-10-CM | POA: Diagnosis not present

## 2017-09-12 LAB — GC/CHLAMYDIA PROBE AMP (~~LOC~~) NOT AT ARMC
Chlamydia: NEGATIVE
Neisseria Gonorrhea: NEGATIVE

## 2017-09-13 LAB — HIV ANTIBODY (ROUTINE TESTING W REFLEX): HIV Screen 4th Generation wRfx: NONREACTIVE

## 2017-09-14 DIAGNOSIS — O039 Complete or unspecified spontaneous abortion without complication: Secondary | ICD-10-CM | POA: Diagnosis not present

## 2017-09-21 DIAGNOSIS — O039 Complete or unspecified spontaneous abortion without complication: Secondary | ICD-10-CM | POA: Diagnosis not present

## 2017-09-21 DIAGNOSIS — J069 Acute upper respiratory infection, unspecified: Secondary | ICD-10-CM | POA: Diagnosis not present

## 2017-09-28 DIAGNOSIS — O039 Complete or unspecified spontaneous abortion without complication: Secondary | ICD-10-CM | POA: Diagnosis not present

## 2017-10-17 DIAGNOSIS — Z3202 Encounter for pregnancy test, result negative: Secondary | ICD-10-CM | POA: Diagnosis not present

## 2017-10-17 DIAGNOSIS — Z30017 Encounter for initial prescription of implantable subdermal contraceptive: Secondary | ICD-10-CM | POA: Diagnosis not present

## 2017-12-12 DIAGNOSIS — Z23 Encounter for immunization: Secondary | ICD-10-CM | POA: Diagnosis not present

## 2018-06-28 DIAGNOSIS — Z01419 Encounter for gynecological examination (general) (routine) without abnormal findings: Secondary | ICD-10-CM | POA: Diagnosis not present

## 2018-06-28 DIAGNOSIS — N6310 Unspecified lump in the right breast, unspecified quadrant: Secondary | ICD-10-CM | POA: Diagnosis not present

## 2018-06-28 DIAGNOSIS — Z6838 Body mass index (BMI) 38.0-38.9, adult: Secondary | ICD-10-CM | POA: Diagnosis not present

## 2018-06-29 ENCOUNTER — Other Ambulatory Visit: Payer: Self-pay | Admitting: Obstetrics and Gynecology

## 2018-06-29 DIAGNOSIS — N6320 Unspecified lump in the left breast, unspecified quadrant: Secondary | ICD-10-CM

## 2018-06-29 DIAGNOSIS — N6310 Unspecified lump in the right breast, unspecified quadrant: Secondary | ICD-10-CM

## 2018-07-06 ENCOUNTER — Encounter (HOSPITAL_COMMUNITY): Payer: Self-pay

## 2018-07-14 ENCOUNTER — Ambulatory Visit
Admission: RE | Admit: 2018-07-14 | Discharge: 2018-07-14 | Disposition: A | Discharge status: Home / Self Care | Payer: 59 | Source: Ambulatory Visit | Attending: Obstetrics and Gynecology | Admitting: Obstetrics and Gynecology

## 2018-07-14 ENCOUNTER — Other Ambulatory Visit: Payer: BLUE CROSS/BLUE SHIELD

## 2018-07-14 ENCOUNTER — Other Ambulatory Visit: Payer: Self-pay

## 2018-07-14 DIAGNOSIS — N6320 Unspecified lump in the left breast, unspecified quadrant: Secondary | ICD-10-CM

## 2018-07-14 DIAGNOSIS — N6310 Unspecified lump in the right breast, unspecified quadrant: Secondary | ICD-10-CM

## 2018-07-14 DIAGNOSIS — N644 Mastodynia: Secondary | ICD-10-CM | POA: Diagnosis not present

## 2019-05-06 ENCOUNTER — Ambulatory Visit: Payer: 59 | Attending: Internal Medicine

## 2019-05-06 DIAGNOSIS — Z23 Encounter for immunization: Secondary | ICD-10-CM | POA: Insufficient documentation

## 2019-05-06 NOTE — Progress Notes (Signed)
   Covid-19 Vaccination Clinic  Name:  Abigail Salas    MRN: [id number] DOB: 06-Nov-1995  05/06/2019  Ms. Abigail Salas was observed post Covid-19 immunization for 15 minutes without incident. She was provided with Vaccine Information Sheet and instruction to access the V-Safe system.   Ms. Abigail Salas was instructed to call 911 with any severe reactions post vaccine: Marland Kitchen Difficulty breathing  . Swelling of face and throat  . A fast heartbeat  . A bad rash all over body  . Dizziness and weakness   Immunizations Administered    Name Date Dose VIS Date Route   Pfizer COVID-19 Vaccine 05/06/2019  8:31 AM 0.3 mL 02/09/2019 Intramuscular   Manufacturer: [place]   Lot: [id number]   [place]: [id number]

## 2019-06-06 ENCOUNTER — Ambulatory Visit: Payer: 59 | Attending: Internal Medicine

## 2019-06-06 DIAGNOSIS — Z23 Encounter for immunization: Secondary | ICD-10-CM

## 2019-06-06 NOTE — Progress Notes (Signed)
   Covid-19 Vaccination Clinic  Name:  Abigail Salas    MRN: [id number] DOB: Jul 18, 1995  06/06/2019  [name] was observed post Covid-19 immunization for 15 minutes without incident. She was provided with Vaccine Information Sheet and instruction to access the V-Safe system.   [name] was instructed to call 911 with any severe reactions post vaccine: Marland Kitchen Difficulty breathing  . Swelling of face and throat  . A fast heartbeat  . A bad rash all over body  . Dizziness and weakness   Immunizations Administered    Name Date Dose VIS Date Route   Pfizer COVID-19 Vaccine 06/06/2019  4:51 PM 0.3 mL 02/09/2019 Intramuscular   Manufacturer: [place]   Lot: [id number]   [place]: [id number]

## 2019-07-26 ENCOUNTER — Ambulatory Visit
Admission: EM | Admit: 2019-07-26 | Discharge: 2019-07-26 | Disposition: A | Discharge status: Home / Self Care | Payer: 59 | Attending: Emergency Medicine | Admitting: Emergency Medicine

## 2019-07-26 ENCOUNTER — Other Ambulatory Visit: Payer: Self-pay

## 2019-07-26 DIAGNOSIS — Z23 Encounter for immunization: Secondary | ICD-10-CM | POA: Diagnosis not present

## 2019-07-26 DIAGNOSIS — S70311A Abrasion, right thigh, initial encounter: Secondary | ICD-10-CM

## 2019-07-26 DIAGNOSIS — W19XXXA Unspecified fall, initial encounter: Secondary | ICD-10-CM | POA: Diagnosis not present

## 2019-07-26 MED ORDER — IBUPROFEN 800 MG PO TABS: 800.0000 mg | ORAL_TABLET | Freq: Three times a day (TID) | ORAL | 0 refills | Status: DC

## 2019-07-26 MED ORDER — TETANUS-DIPHTH-ACELL PERTUSSIS 5-2.5-18.5 LF-MCG/0.5 IM SUSP
0.5000 mL | Freq: Once | INTRAMUSCULAR | Status: AC
  Administered 2019-07-26: 0.5 mL via INTRAMUSCULAR

## 2019-07-26 NOTE — Discharge Instructions (Addendum)
Tetanus is updated today. Take ibuprofen 3 times daily with food additional pain and swelling relief. May use cool compresses: Avoid ice as your skin will be more sensitive. Important to keep wound covered and change dressing once daily as discussed. Return for worsening pain, smelly discharge, fever.

## 2019-07-26 NOTE — ED Triage Notes (Signed)
Slip and fall yesterday while hiking, abrasion to right hip

## 2019-07-26 NOTE — ED Provider Notes (Signed)
[name]    CSN: [id number] Arrival date & time: 07/26/19  1027      History   Chief Complaint Chief Complaint  Patient presents with  . Fall  . Abrasion    HPI Abigail Salas is a 24 y.o. female with history of migraines presenting for right lateral thigh abrasion.  States she slipped and fell while hiking yesterday.  Denies head trauma, LOC.  Was able to clean wound with hydrogen peroxide.  Last tetanus unknown.  Denies purulent discharge, malodor, prolonged bleeding.   Past Medical History:  Diagnosis Date  . 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 Migraines     Patient Active Problem List   Diagnosis Date Noted  . 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 BREAST EXCISIONAL BIOPSY Left 04/04/2017   Fibroadenoma  . BREAST LUMPECTOMY Left 04/04/2017   Procedure: EXCISION OF LEFT BREAST FIBROADENOMA;  Surgeon: Fanny Skates, MD;  Location: [facility];  Service: General;  Laterality: Left;  . KNEE ARTHROSCOPY W/ ACL RECONSTRUCTION Right     OB History    Gravida  1   Para      Term      Preterm      AB      Living        SAB      TAB      Ectopic      Multiple      Live Births               Home Medications    Prior to Admission medications   Not on File    Family History Family History  Problem Relation Age of Onset  . Breast cancer Maternal Aunt   . Cancer Paternal Grandfather   . Cancer Maternal Aunt   . Healthy Mother   . Healthy Father     Social History Social History   Tobacco Use  . Smoking status: Former Research scientist (life sciences)  . Smokeless tobacco: Never Used  Substance Use Topics  . Alcohol use: No  . Drug use: No     Allergies   Patient has no known allergies.   Review of Systems As per HPI   Physical Exam Triage Vital Signs ED Triage Vitals  Enc Vitals Group     BP 07/26/19 1038 123/81   Pulse Rate 07/26/19 1038 83     Resp 07/26/19 1038 16     Temp 07/26/19 1038 98 F (36.7 C)     Temp Source 07/26/19 1038 Oral     SpO2 07/26/19 1038 98 %     Weight --      Height --      Head Circumference --      Peak Flow --      Pain Score 07/26/19 1052 0     Pain Loc --      Pain Edu? --      Excl. in [place]? --    No data found.  Updated Vital Signs BP 123/81 (BP Location: Left Arm)   Pulse 83   Temp 98 F (36.7 C) (Oral)   Resp 16   LMP  (LMP Unknown)   SpO2 98%   Visual Acuity Right Eye Distance:   Left Eye Distance:   Bilateral Distance:    Right Eye Near:   Left Eye Near:    Bilateral Near:  Physical Exam Constitutional:      General: She is not in acute distress. HENT:     Head: Normocephalic and atraumatic.  Eyes:     General: No scleral icterus.    Pupils: Pupils are equal, round, and reactive to light.  Cardiovascular:     Rate and Rhythm: Normal rate.  Pulmonary:     Effort: Pulmonary effort is normal.  Musculoskeletal:        General: Normal range of motion.     Comments: Mild swelling TTP of right posterolateral thigh around abrasion.  Skin:    Coloration: Skin is not jaundiced or pale.     Findings: No bruising or erythema.     Comments: Large superficial abrasion to right posterolateral thigh.  Approximately 10 cm at largest diameter.  No open wound, contamination, discharge.  Neurological:     Mental Status: She is alert and oriented to person, place, and time.      UC Treatments / Results  Labs (all labs ordered are listed, but only abnormal results are displayed) Labs Reviewed - No data to display  EKG   Radiology No results found.  Procedures Procedures (including critical care time)  Medications Ordered in UC Medications  Tdap (BOOSTRIX) injection 0.5 mL (has no administration in time range)    Initial Impression / Assessment and Plan / UC Course  I have reviewed the triage vital signs and the nursing  notes.  Pertinent labs & imaging results that were available during my care of the patient were reviewed by me and considered in my medical decision making (see chart for details).     Patient febrile, nontoxic in office today.  Patient does have large superficial abrasion which was dressed in office: Patient tolerated this well.  Tetanus updated in office.  Reviewed supportive care as outlined below.  Work note provided at patient's request.  Return precautions discussed, patient verbalized understanding and is agreeable to plan. Final Clinical Impressions(s) / UC Diagnoses   Final diagnoses:  Abrasion of right thigh, initial encounter  Fall, initial encounter     Discharge Instructions     Tetanus is updated today. Take ibuprofen 3 times daily with food additional pain and swelling relief. May use cool compresses: Avoid ice as your skin will be more sensitive. Important to keep wound covered and change dressing once daily as discussed. Return for worsening pain, smelly discharge, fever.    ED Prescriptions    None     PDMP not reviewed this encounter.   Hall-Potvin, [place], [place] 07/26/19 1118

## 2019-08-06 ENCOUNTER — Ambulatory Visit
Admission: EM | Admit: 2019-08-06 | Discharge: 2019-08-06 | Disposition: A | Discharge status: Home / Self Care | Payer: 59

## 2019-08-06 ENCOUNTER — Other Ambulatory Visit: Payer: Self-pay

## 2019-08-06 ENCOUNTER — Encounter: Payer: Self-pay | Admitting: Emergency Medicine

## 2019-08-06 DIAGNOSIS — J029 Acute pharyngitis, unspecified: Secondary | ICD-10-CM | POA: Diagnosis not present

## 2019-08-06 MED ORDER — AMOXICILLIN 500 MG PO CAPS: 500.0000 mg | ORAL_CAPSULE | Freq: Two times a day (BID) | ORAL | 0 refills | Status: DC

## 2019-08-06 MED ORDER — LIDOCAINE VISCOUS HCL 2 % MT SOLN: 15.0000 mL | OROMUCOSAL | 0 refills | Status: DC | PRN

## 2019-08-06 NOTE — Discharge Instructions (Signed)
COVID PCR testing ordered. I would like you to quarantine until testing results. We have deferred strep testing. Given your exam, will cover you empirically for bacterial infection with amoxicillin. As discussed, symptoms can still be due to viral illness/ drainage down your throat. This usually takes 7-10 days to resolve. Start lidocaine for sore throat, do not eat or drink for the next 40 mins after use as it can stunt your gag reflex. You can take over the counter allergy medicine such as Flonase, Zyrtec-D for nasal congestion/drainage. You can use over the counter nasal saline rinse such as neti pot for nasal congestion. Monitor for any worsening of symptoms, swelling of the throat, trouble breathing, trouble swallowing, leaning forward to breath, drooling, go to the emergency department for further evaluation needed.

## 2019-08-06 NOTE — ED Provider Notes (Signed)
[name]    CSN: [id number] Arrival date & time: 08/06/19  1201      History   Chief Complaint Chief Complaint  Patient presents with  . Sore Throat    HPI Abigail Salas is a 24 y.o. female.   24 year old female comes in for 2 day of sore throat, malaise. Has baseline nasal congestion in the morning that has not changed. Denies rhinorrhea, cough. Denies fever. Denies abdominal pain, nausea, vomiting, diarrhea. Denies shortness of breath, loss of taste/smell. Multiple exposures to strep.  covid vaccine in march 2021.  Received [employer]     Past Medical History:  Diagnosis Date  . 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 Migraines     Patient Active Problem List   Diagnosis Date Noted  . 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 BREAST EXCISIONAL BIOPSY Left 04/04/2017   Fibroadenoma  . BREAST LUMPECTOMY Left 04/04/2017   Procedure: EXCISION OF LEFT BREAST FIBROADENOMA;  Surgeon: Fanny Skates, MD;  Location: [facility];  Service: General;  Laterality: Left;  . KNEE ARTHROSCOPY W/ ACL RECONSTRUCTION Right     OB History    Gravida  1   Para      Term      Preterm      AB      Living        SAB      TAB      Ectopic      Multiple      Live Births               Home Medications    Prior to Admission medications   Medication Sig Start Date End Date Taking? Authorizing Provider  ibuprofen (ADVIL) 800 MG tablet Take 1 tablet (800 mg total) by mouth 3 (three) times daily. 07/26/19  Yes Hall-Potvin, [place], PA-C  amoxicillin (AMOXIL) 500 MG capsule Take 1 capsule (500 mg total) by mouth 2 (two) times daily. 08/06/19   Tasia Catchings, Isaack Preble V, PA-C  lidocaine (XYLOCAINE) 2 % solution Use as directed 15 mLs in the mouth or throat as needed for mouth pain. 08/06/19   Shealee Yordy V, PA-C  LO LOESTRIN FE 1 MG-10 MCG / 10 MCG tablet Take 1 tablet by  mouth daily. 07/16/19   [provider]    Family History Family History  Problem Relation Age of Onset  . Breast cancer Maternal Aunt   . Cancer Paternal Grandfather   . Cancer Maternal Aunt   . Healthy Mother   . Healthy Father     Social History Social History   Tobacco Use  . Smoking status: Former Research scientist (life sciences)  . Smokeless tobacco: Never Used  Substance Use Topics  . Alcohol use: No  . Drug use: No     Allergies   Patient has no known allergies.   Review of Systems Review of Systems  Reason unable to perform ROS: See HPI as above.     Physical Exam Triage Vital Signs ED Triage Vitals  Enc Vitals Group     BP 08/06/19 1210 135/81     Pulse Rate 08/06/19 1210 91     Resp 08/06/19 1210 20     Temp 08/06/19 1210 99.8 F (37.7 C)     Temp Source 08/06/19 1210 Oral     SpO2 08/06/19 1210 98 %     Weight --  Height --      Head Circumference --      Peak Flow --      Pain Score 08/06/19 1207 10     Pain Loc --      Pain Edu? --      Excl. in [place]? --    No data found.  Updated Vital Signs BP 135/81 (BP Location: Left Arm)   Pulse 91   Temp 99.8 F (37.7 C) (Oral)   Resp 20   LMP  (LMP Unknown)   SpO2 98%   Physical Exam Constitutional:      General: She is not in acute distress.    Appearance: Normal appearance. She is well-developed. She is not ill-appearing, toxic-appearing or diaphoretic.  HENT:     Head: Normocephalic and atraumatic.     Right Ear: Tympanic membrane, ear canal and external ear normal. Tympanic membrane is not erythematous or bulging.     Left Ear: Tympanic membrane, ear canal and external ear normal. Tympanic membrane is not erythematous or bulging.     Nose:     Right Sinus: No maxillary sinus tenderness or frontal sinus tenderness.     Left Sinus: No maxillary sinus tenderness or frontal sinus tenderness.     Mouth/Throat:     Mouth: Mucous membranes are moist.     Pharynx: Oropharynx is clear. Uvula midline.      Tonsils: Tonsillar exudate present. 1+ on the right. 1+ on the left.  Eyes:     Conjunctiva/sclera: Conjunctivae normal.     Pupils: Pupils are equal, round, and reactive to light.  Cardiovascular:     Rate and Rhythm: Normal rate and regular rhythm.  Pulmonary:     Effort: Pulmonary effort is normal. No accessory muscle usage, prolonged expiration, respiratory distress or retractions.     Breath sounds: No decreased air movement or transmitted upper airway sounds. No decreased breath sounds.     Comments: LCTAB Musculoskeletal:     Cervical back: Normal range of motion and neck supple.  Lymphadenopathy:     Cervical: Cervical adenopathy present.  Skin:    General: Skin is warm and dry.  Neurological:     Mental Status: She is alert and oriented to person, place, and time.      UC Treatments / Results  Labs (all labs ordered are listed, but only abnormal results are displayed) Labs Reviewed  NOVEL CORONAVIRUS, NAA    EKG   Radiology No results found.  Procedures Procedures (including critical care time)  Medications Ordered in UC Medications - No data to display  Initial Impression / Assessment and Plan / UC Course  I have reviewed the triage vital signs and the nursing notes.  Pertinent labs & imaging results that were available during my care of the patient were reviewed by me and considered in my medical decision making (see chart for details).    Discussed given history and exam, would cover for tonsillitis regardless of rapid strep. Patient defers rapid strep for now. Will also send for COVID testing for possible viral illness causing symptoms. To quarantine until testing results return. Other symptomatic treatment discussed. Return precautions given.  Final Clinical Impressions(s) / UC Diagnoses   Final diagnoses:  Sore throat   ED Prescriptions    Medication Sig Dispense Auth. Provider   amoxicillin (AMOXIL) 500 MG capsule Take 1 capsule (500 mg total)  by mouth 2 (two) times daily. 20 capsule Kumiko Fishman V, PA-C   lidocaine (XYLOCAINE) 2 %  solution Use as directed 15 mLs in the mouth or throat as needed for mouth pain. 100 mL Ok Edwards, PA-C     PDMP not reviewed this encounter.   Ok Edwards, PA-C 08/06/19 1326

## 2019-08-06 NOTE — ED Notes (Signed)
Patient's nasal swab obtained, labeled and provided to lab for covid testing

## 2019-08-06 NOTE — ED Triage Notes (Addendum)
Reports sore throat started early yesterday morning.  Reports throat is swollen.  General malaise Denies runny nose or cough, denies fever  covid vaccine in march 2021.  Received [employer]

## 2019-08-07 LAB — SARS-COV-2, NAA 2 DAY TAT

## 2019-08-07 LAB — NOVEL CORONAVIRUS, NAA: SARS-CoV-2, NAA: NOT DETECTED

## 2019-11-20 ENCOUNTER — Other Ambulatory Visit: Payer: Self-pay

## 2019-11-20 ENCOUNTER — Other Ambulatory Visit: Payer: Self-pay | Admitting: Radiology

## 2019-11-20 ENCOUNTER — Ambulatory Visit
Admission: RE | Admit: 2019-11-20 | Discharge: 2019-11-20 | Disposition: A | Discharge status: Home / Self Care | Payer: 59 | Source: Ambulatory Visit | Attending: Radiology | Admitting: Radiology

## 2019-11-20 DIAGNOSIS — N6311 Unspecified lump in the right breast, upper outer quadrant: Secondary | ICD-10-CM

## 2020-01-01 IMAGING — US US OB TRANSVAGINAL
1 series · 15 of 27 positions shown · non-contrast
Comparison: 06/12/2010 pelvic ultrasound

CLINICAL DATA: 22 y/o F; vaginal bleeding. Recent removal of IUD
with pregnancy. Beta hCG pending.

EXAM:
OBSTETRIC <14 WK US AND TRANSVAGINAL OB US
TECHNIQUE: Both transabdominal and transvaginal ultrasound examinations were
performed for complete evaluation of the gestation as well as the
maternal uterus, adnexal regions, and pelvic cul-de-sac.
Transvaginal technique was performed to assess early pregnancy.

[Series 1: us ob transvaginal · 15 of 27 slices shown]
[im 1/27]
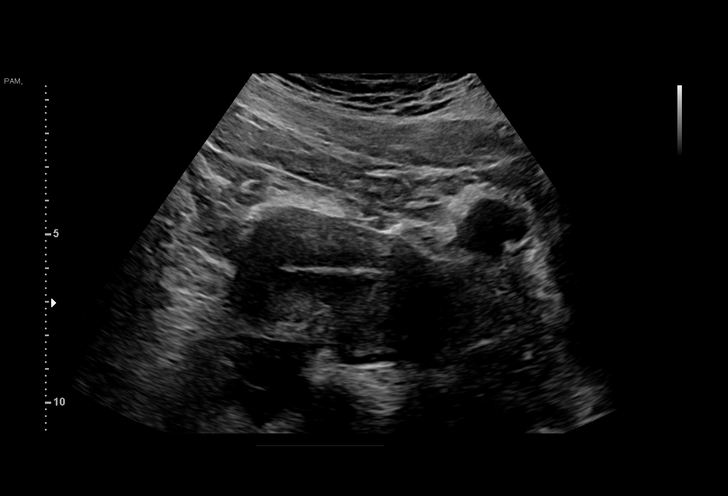
[im 3/27]
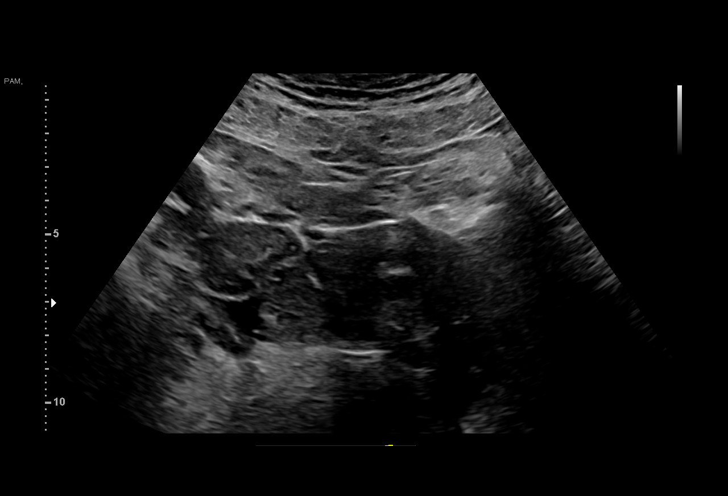
[im 5/27]
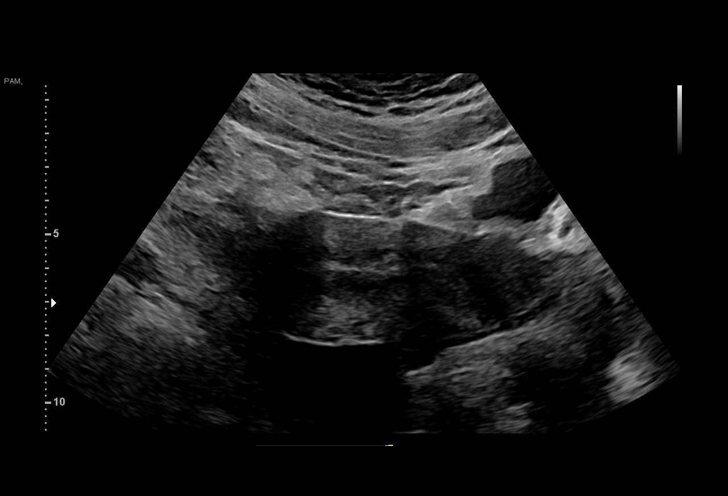
[im 7/27]
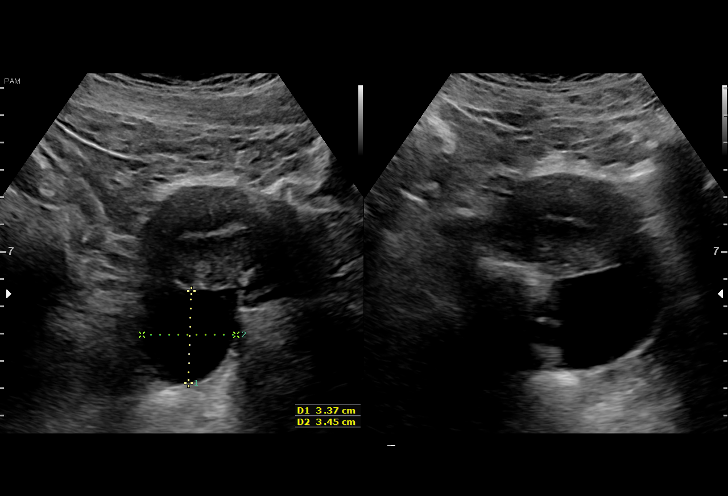
[im 9/27]
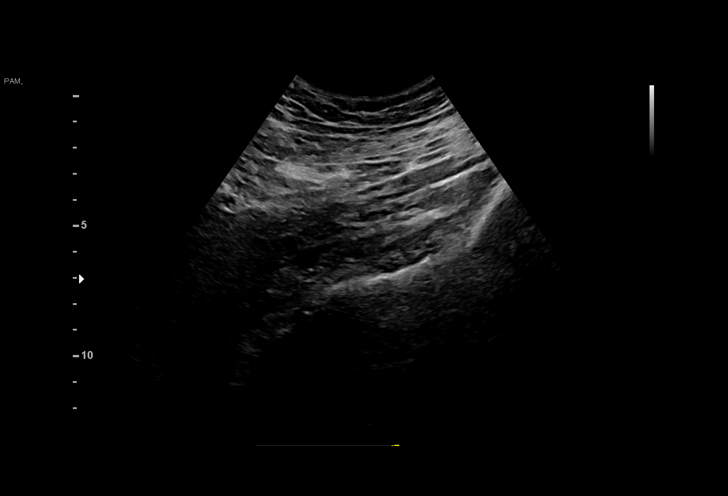
[im 10/27]
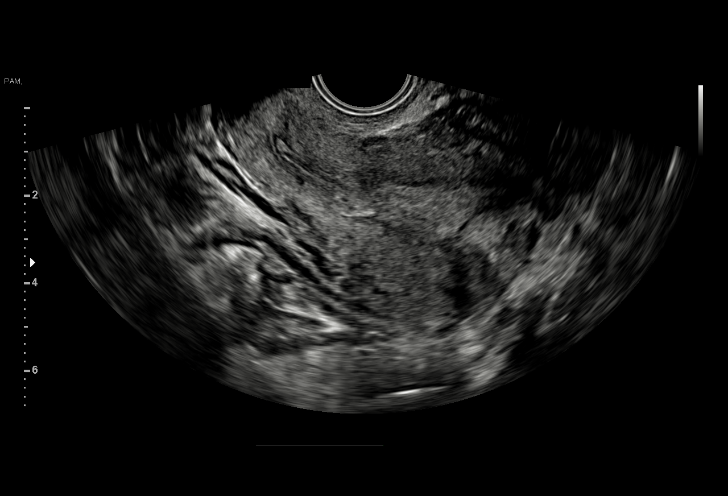
[im 12/27]
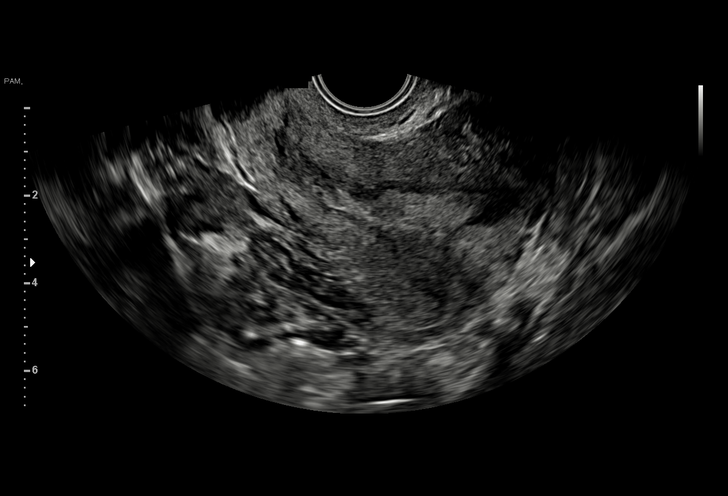
[im 14/27]
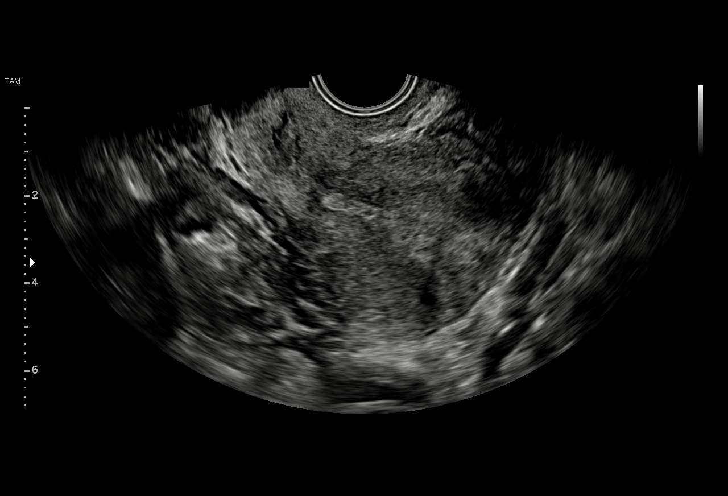
[im 16/27]
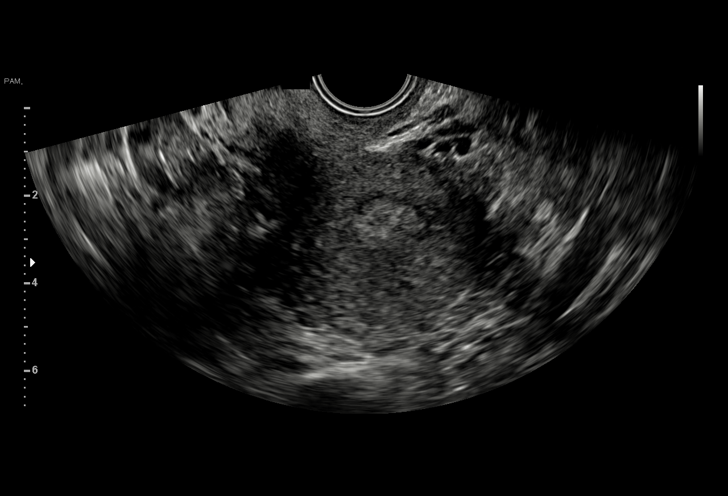
[im 18/27]
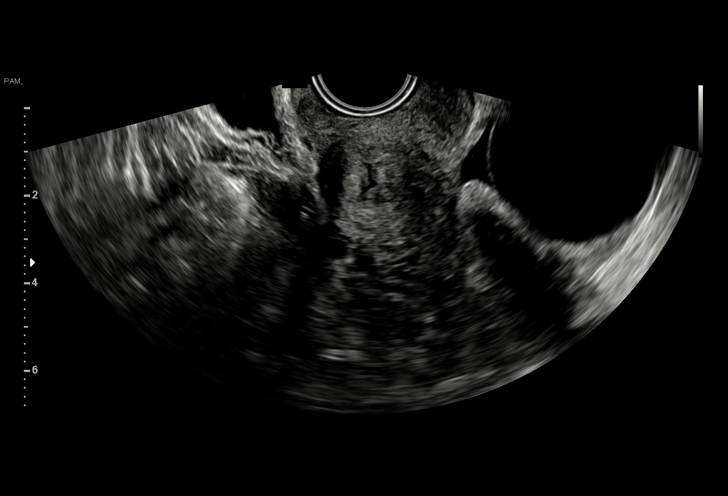
[im 19/27]
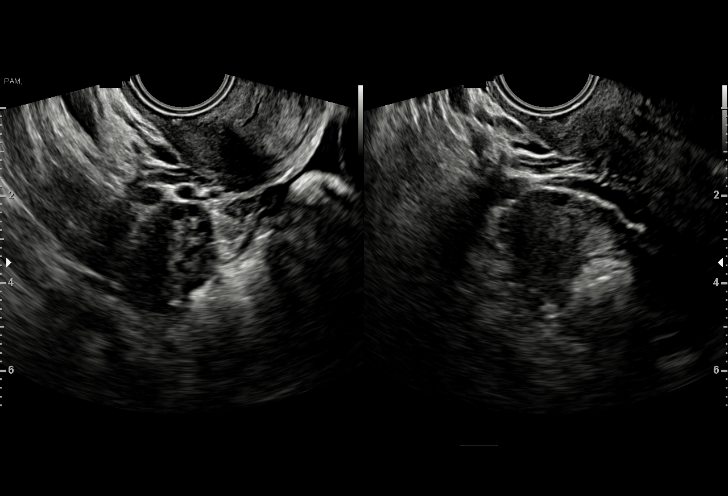
[im 21/27]
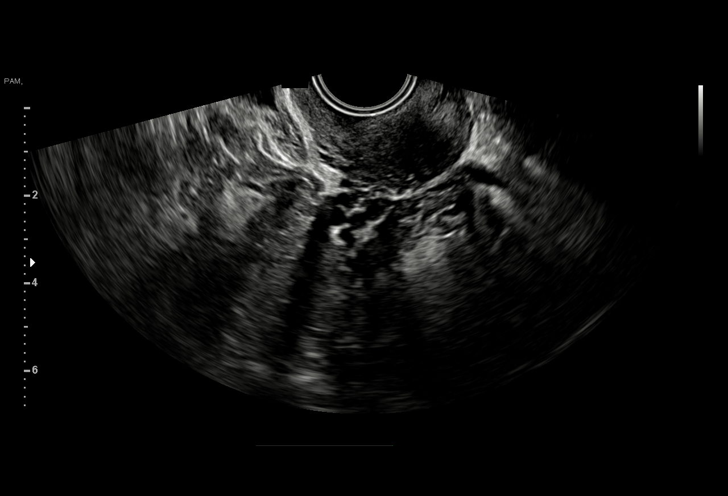
[im 23/27]
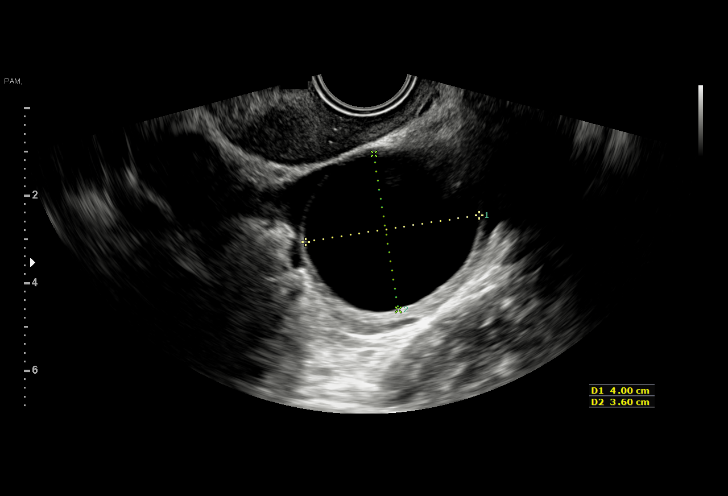
[im 25/27]
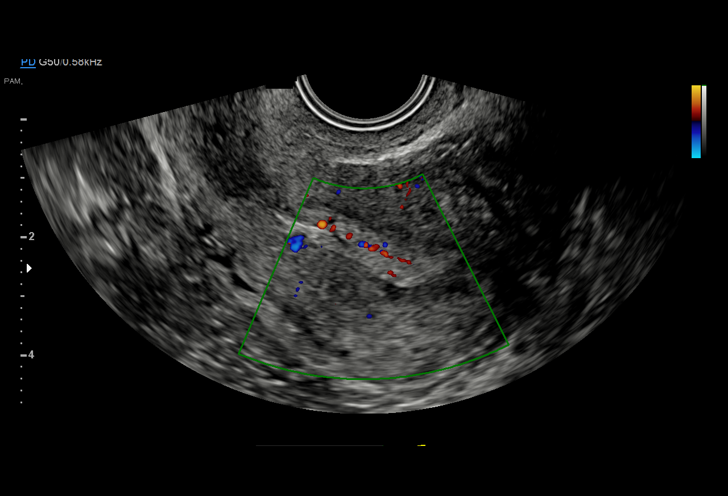
[im 27/27]
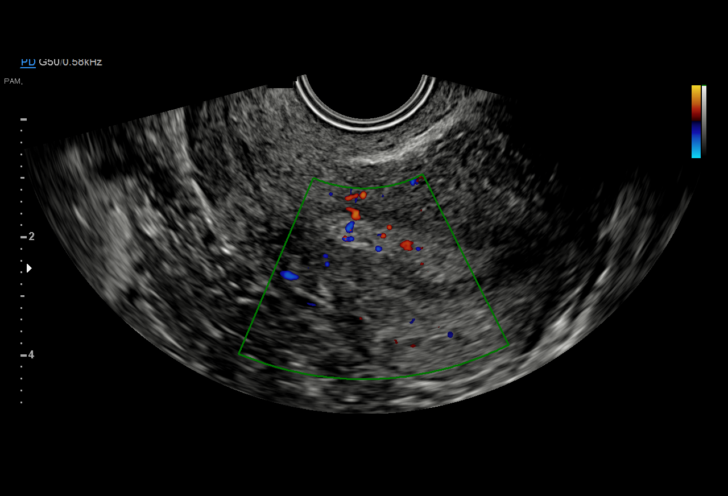

[15 of 27 positions shown; findings below may reference images not displayed]

FINDINGS: Intrauterine gestational sac: None

Yolk sac:  Not Visualized.

Embryo:  Not Visualized.

Cardiac Activity: Not Visualized.

Subchorionic hemorrhage:  None visualized.

Maternal uterus/adnexae: Simple left ovary cyst measuring up to 4
cm. Thickened endometrium to 15 mm.
IMPRESSION: No pregnancy identified. If beta hCG is positive than this would
represent a pregnancy of unknown location or spontaneous abortion.
Intrauterine pregnancy may not be identified with a beta HCG of less
than [DATE]. Follow-up beta HCG and possible pelvic ultrasound may be
indicated to evaluate pregnancy location. This recommendation
follows SRU consensus guidelines: Diagnostic Criteria for Nonviable
Pregnancy Early in the First Trimester. N Engl J Med 1965;

By: Navula Baulet M.D.

## 2020-05-06 ENCOUNTER — Other Ambulatory Visit: Payer: 59

## 2020-05-08 ENCOUNTER — Other Ambulatory Visit: Payer: 59

## 2020-05-20 ENCOUNTER — Other Ambulatory Visit: Payer: 59

## 2020-06-04 ENCOUNTER — Other Ambulatory Visit: Payer: 59

## 2020-06-25 ENCOUNTER — Other Ambulatory Visit: Payer: Self-pay

## 2020-06-25 ENCOUNTER — Other Ambulatory Visit: Payer: Self-pay | Admitting: Radiology

## 2020-06-25 ENCOUNTER — Ambulatory Visit
Admission: RE | Admit: 2020-06-25 | Discharge: 2020-06-25 | Disposition: A | Discharge status: Home / Self Care | Payer: BC Managed Care – PPO | Source: Ambulatory Visit | Attending: Radiology | Admitting: Radiology

## 2020-06-25 DIAGNOSIS — N6311 Unspecified lump in the right breast, upper outer quadrant: Secondary | ICD-10-CM

## 2020-12-25 ENCOUNTER — Ambulatory Visit
Admission: RE | Admit: 2020-12-25 | Discharge: 2020-12-25 | Disposition: A | Discharge status: Home / Self Care | Payer: BC Managed Care – PPO | Source: Ambulatory Visit | Attending: Radiology | Admitting: Radiology

## 2020-12-25 ENCOUNTER — Other Ambulatory Visit: Payer: Self-pay

## 2020-12-25 DIAGNOSIS — N6311 Unspecified lump in the right breast, upper outer quadrant: Secondary | ICD-10-CM

## 2021-06-24 ENCOUNTER — Encounter: Payer: Self-pay | Admitting: Emergency Medicine

## 2021-06-24 ENCOUNTER — Ambulatory Visit
Admission: EM | Admit: 2021-06-24 | Discharge: 2021-06-24 | Disposition: A | Discharge status: Home / Self Care | Payer: BC Managed Care – PPO | Attending: Internal Medicine | Admitting: Internal Medicine

## 2021-06-24 DIAGNOSIS — G43719 Chronic migraine without aura, intractable, without status migrainosus: Secondary | ICD-10-CM

## 2021-06-24 DIAGNOSIS — H109 Unspecified conjunctivitis: Secondary | ICD-10-CM | POA: Diagnosis not present

## 2021-06-24 MED ORDER — ERYTHROMYCIN 5 MG/GM OP OINT: TOPICAL_OINTMENT | OPHTHALMIC | 0 refills | Status: DC

## 2021-06-24 MED ORDER — DEXAMETHASONE SODIUM PHOSPHATE 10 MG/ML IJ SOLN
10.0000 mg | Freq: Once | INTRAMUSCULAR | Status: AC
  Administered 2021-06-24: 10 mg via INTRAMUSCULAR

## 2021-06-24 MED ORDER — KETOROLAC TROMETHAMINE 30 MG/ML IJ SOLN
30.0000 mg | Freq: Once | INTRAMUSCULAR | Status: AC
  Administered 2021-06-24: 30 mg via INTRAMUSCULAR

## 2021-06-24 NOTE — ED Triage Notes (Signed)
Patient c/o for the past 2 days she's woken up the last with her right eye red and with drainage on it.  When putting eye drops in her eye, it does burn, no blurred vision.  Patient also having increased headaches as well. ?

## 2021-06-24 NOTE — Discharge Instructions (Signed)
You have pinkeye which is being treated with antibiotic ointment.  Follow-up with eye doctor if symptoms persist or worsen.  You have been given 2 injections today in urgent care for migraine.  Please do not take any additional ibuprofen, Advil, Aleve for at least 24 hours.  Follow-up with headache clinic at provided contact information due to more frequent migraines. ?

## 2021-06-24 NOTE — ED Provider Notes (Signed)
?[facility] ? ? ? ?CSN: [id number] ?Arrival date & time: 06/24/21  1047 ? ? ?  ? ?History   ?Chief Complaint ?Chief Complaint  ?Patient presents with  ? Eye Problem  ? ? ?HPI ?Abigail Salas is a 26 y.o. female.  ? ?Patient presents with 2 different chief complaints today.  The first complaint is right eye redness, irritation, drainage that started approximately 2 days ago.  Patient reports that she has woke up with crustiness and purulent drainage in the eye.  Patient reports blurry vision at baseline but no changes to vision since symptoms started.  Denies trauma or foreign body to the eye.  Patient reports that she has associated cold-like symptoms and works in a school system.  Patient does not wear contacts but does wear glasses. ? ?Patient also complaining of more frequent migraines over the past year.  She reports approximately 1 migraine a month.  She typically takes ibuprofen with resolution but states that she has had a migraine over the past few days with no improvement with ibuprofen.  She has not taken any medications today.  She was seen her PCP for her migraines in the past but states that she stopped seeing him because he told her that "her migraines were in her head".  Her migraines are typically in the front portion of her head and she has associated blurry vision.  Denies any associated dizziness, nausea, vomiting.  Patient currently has a migraine that is rated 8/10 on pain scale. ? ? ?Eye Problem ? ?Past Medical History:  ?Diagnosis Date  ? 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 Migraines   ? ? ?Patient Active Problem List  ? Diagnosis Date Noted  ? 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 ? ?Past Surgical History:  ?Procedure Laterality Date  ? BREAST EXCISIONAL BIOPSY Left 04/04/2017  ? Fibroadenoma  ? BREAST LUMPECTOMY Left 04/04/2017  ? Procedure: EXCISION OF LEFT BREAST FIBROADENOMA;  Surgeon: Fanny Skates, MD;  Location: [facility];  Service: General;  Laterality: Left;  ? KNEE ARTHROSCOPY W/ ACL RECONSTRUCTION Right   ? ? ?OB History   ? ? Gravida  ?1  ? Para  ?   ? Term  ?   ? Preterm  ?   ? AB  ?   ? Living  ?   ?  ? ? SAB  ?   ? IAB  ?   ? Ectopic  ?   ? Multiple  ?   ? Live Births  ?   ?   ?  ?  ? ? ? ?Home Medications   ? ?Prior to Admission medications   ?Medication Sig Start Date End Date Taking? Authorizing Provider  ?erythromycin ophthalmic ointment Place a 1/2 inch ribbon of ointment into the lower eyelid 4 times daily for 7 days. 06/24/21  Yes Teodora Medici, FNP  ?LO LOESTRIN FE 1 MG-10 MCG / 10 MCG tablet Take 1 tablet by mouth daily. 07/16/19  Yes [provider]  ?amoxicillin (AMOXIL) 500 MG capsule Take 1 capsule (500 mg total) by mouth 2 (two) times daily. 08/06/19   Tasia Catchings, Amy V, PA-C  ?ibuprofen (ADVIL) 800 MG tablet Take 1 tablet (800 mg total) by mouth 3 (three) times daily. 07/26/19   Hall-Potvin, [place], PA-C  ?lidocaine (XYLOCAINE) 2 % solution Use as directed 15 mLs in the mouth or throat as needed for mouth pain. 08/06/19   Tasia Catchings,  Amy V, PA-C  ? ? ?Family History ?Family History  ?Problem Relation Age of Onset  ? Breast cancer Maternal Aunt   ? Cancer Paternal Grandfather   ? Cancer Maternal Aunt   ? Healthy Mother   ? Healthy Father   ? ? ?Social History ?Social History  ? ?Tobacco Use  ? Smoking status: Former  ? Smokeless tobacco: Never  ?Vaping Use  ? Vaping Use: Never used  ?Substance Use Topics  ? Alcohol use: No  ? Drug use: No  ? ? ? ?Allergies   ?Patient has no known allergies. ? ? ?Review of Systems ?Review of Systems ?Per HPI ? ?Physical Exam ?Triage Vital Signs ?ED Triage Vitals  ?Enc Vitals Group  ?   BP 06/24/21 1100 118/79  ?   Pulse Rate 06/24/21 1100 80  ?   Resp 06/24/21 1100 18  ?   Temp 06/24/21 1100 98.1 ?F (36.7 ?C)  ?   Temp Source 06/24/21 1100 Oral  ?   SpO2 06/24/21 1100 97 %  ?   Weight 06/24/21 1102 175 lb (79.4 kg)  ?   Height 06/24/21 1102 5' 0.5" (1.537 m)  ?   Head  Circumference --   ?   Peak Flow --   ?   Pain Score 06/24/21 1102 2  ?   Pain Loc --   ?   Pain Edu? --   ?   Excl. in [place]? --   ? ?No data found. ? ?Updated Vital Signs ?BP 118/79 (BP Location: Left Arm)   Pulse 80   Temp 98.1 ?F (36.7 ?C) (Oral)   Resp 18   Ht 5' 0.5" (1.537 m)   Wt 175 lb (79.4 kg)   SpO2 97%   Breastfeeding No   BMI 33.61 kg/m?  ? ?Visual Acuity ?Right Eye Distance: 20 20 ?Left Eye Distance: 20 20 ?Bilateral Distance: 20 20 ? ?Right Eye Near:   ?Left Eye Near:    ?Bilateral Near:    ? ?Physical Exam ?Constitutional:   ?   General: She is not in acute distress. ?   Appearance: Normal appearance. She is not toxic-appearing or diaphoretic.  ?HENT:  ?   Head: Normocephalic and atraumatic.  ?Eyes:  ?   General: Lids are normal. Lids are everted, no foreign bodies appreciated. Vision grossly intact. Gaze aligned appropriately.  ?   Extraocular Movements: Extraocular movements intact.  ?   Conjunctiva/sclera:  ?   Right eye: Right conjunctiva is injected. Exudate present. No chemosis or hemorrhage. ?   Left eye: Left conjunctiva is not injected. No chemosis, exudate or hemorrhage. ?   Pupils: Pupils are equal, round, and reactive to light.  ?Cardiovascular:  ?   Rate and Rhythm: Normal rate and regular rhythm.  ?   Pulses: Normal pulses.  ?   Heart sounds: Normal heart sounds.  ?Pulmonary:  ?   Effort: Pulmonary effort is normal. No respiratory distress.  ?   Breath sounds: Normal breath sounds.  ?Neurological:  ?   General: No focal deficit present.  ?   Mental Status: She is alert and oriented to person, place, and time. Mental status is at baseline.  ?   Cranial Nerves: Cranial nerves 2-12 are intact.  ?   Sensory: Sensation is intact.  ?   Motor: Motor function is intact.  ?   Coordination: Coordination is intact.  ?   Gait: Gait is intact.  ?Psychiatric:     ?  Mood and Affect: Mood normal.     ?   Behavior: Behavior normal.     ?   Thought Content: Thought content normal.     ?    Judgment: Judgment normal.  ? ? ? ?UC Treatments / Results  ?Labs ?(all labs ordered are listed, but only abnormal results are displayed) ?Labs Reviewed - No data to display ? ?EKG ? ? ?Radiology ?No results found. ? ?Procedures ?Procedures (including critical care time) ? ?Medications Ordered in UC ?Medications  ?ketorolac (TORADOL) 30 MG/ML injection 30 mg (30 mg Intramuscular Given 06/24/21 1121)  ?dexamethasone (DECADRON) injection 10 mg (10 mg Intramuscular Given 06/24/21 1121)  ? ? ?Initial Impression / Assessment and Plan / UC Course  ?I have reviewed the triage vital signs and the nursing notes. ? ?Pertinent labs & imaging results that were available during my care of the patient were reviewed by me and considered in my medical decision making (see chart for details). ? ?  ? ?Patient has bacterial conjunctivitis of right eye that is being treated with erythromycin ointment.  Visual acuity is normal today.  Patient to follow-up with eye doctor if eye symptoms persist or worsen. ? ?Patient's migraine appears consistent with her typical migraines in the past.  Neuro exam is normal so do not think that CT imaging is necessary at this time.  IM Toradol and IM Decadron was administered today in urgent care to help alleviate headache.  There does not appear to be any contraindication to this.  Advised patient to use backup birth control method given Decadron was administered today.  Also advised patient not to take any additional NSAIDs for at least 24 hours following Toradol injection.  Patient advised to go the ER if headache does not improve in the next 24 to 48 hours.  Patient was given contact information for headache clinic to have further evaluation and management.  Discussed return and ER precautions.  Patient verbalized understanding and was agreeable with plan. ?Final Clinical Impressions(s) / UC Diagnoses  ? ?Final diagnoses:  ?Bacterial conjunctivitis of right eye  ?Intractable chronic migraine without  aura and without status migrainosus  ? ? ? ?Discharge Instructions   ? ?  ?You have pinkeye which is being treated with antibiotic ointment.  Follow-up with eye doctor if symptoms persist or worsen.  You have been given 2 inje

## 2021-08-19 ENCOUNTER — Ambulatory Visit
Admission: EM | Admit: 2021-08-19 | Discharge: 2021-08-19 | Disposition: A | Discharge status: Home / Self Care | Payer: BC Managed Care – PPO | Attending: Internal Medicine | Admitting: Internal Medicine

## 2021-08-19 ENCOUNTER — Encounter: Payer: Self-pay | Admitting: Emergency Medicine

## 2021-08-19 ENCOUNTER — Other Ambulatory Visit: Payer: Self-pay

## 2021-08-19 DIAGNOSIS — R600 Localized edema: Secondary | ICD-10-CM

## 2021-08-19 DIAGNOSIS — M79604 Pain in right leg: Secondary | ICD-10-CM | POA: Diagnosis not present

## 2021-08-19 LAB — POCT URINALYSIS DIP (MANUAL ENTRY)
Bilirubin, UA: NEGATIVE
Blood, UA: NEGATIVE
Glucose, UA: NEGATIVE mg/dL
Ketones, POC UA: NEGATIVE mg/dL
Leukocytes, UA: NEGATIVE
Nitrite, UA: NEGATIVE
Protein Ur, POC: NEGATIVE mg/dL
Spec Grav, UA: 1.01 (ref 1.010–1.025)
Urobilinogen, UA: 0.2 E.U./dL
pH, UA: 5.5 (ref 5.0–8.0)

## 2021-08-19 MED ORDER — FUROSEMIDE 20 MG PO TABS: 20.0000 mg | ORAL_TABLET | Freq: Every day | ORAL | 0 refills | Status: DC

## 2021-08-19 NOTE — ED Triage Notes (Signed)
Reports pulling sensation in back of right knee for a week.  This sensation has been intermittent until today and it has been constant.  Reports bilateral foot and ankle swelling that also started last week.  Denies any injury.  Denies change in medications.

## 2021-08-19 NOTE — Discharge Instructions (Addendum)
Your urine was clear.  Your blood work is pending.  We will call you if there are any abnormalities.  You have been prescribed a medication that will help alleviate fluid accumulation.  It will make you pee a lot so be aware of this.  You may take muscle relaxer that you have already been prescribed for leg pain as well.  Alternate ice and heat to affected area.  Please follow-up with primary care doctor for further evaluation and management of both of these ailments.  Also elevate extremities to help alleviate swelling.

## 2021-08-19 NOTE — ED Provider Notes (Addendum)
[facility]    CSN: [id number] Arrival date & time: 08/19/21  0845      History   Chief Complaint Chief Complaint  Patient presents with   Edema    HPI Abigail Salas is a 26 y.o. female.   Patient presents with 2 different chief complaints today.  Patient reports that she has had some pain and a "pulling sensation" in the back of her right knee that radiates up posterior thigh that has been present for about a week.  Denies any obvious injury to the area but patient does report that she has been "stripping and waxing floors" so she is not sure if she injured herself doing that.  Patient also reports bilateral foot and ankle swelling that started a few days ago as well.  Denies any injury to that area.  Denies chest pain, shortness of breath, headache, dizziness, blurred vision, nausea, vomiting.  Patient denies that this has ever occurred before.  Pertinent medical history includes migraines.  Patient reports that she sees a headache clinic and has been getting Botox injections and also was prescribed a muscle relaxer that she is not sure the name of that she takes as needed for migraines.     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 Authorizing Provider  furosemide (LASIX) 20 MG tablet Take 1 tablet (20 mg total) by mouth daily for 5 days. 08/19/21 08/24/21 Yes Jerald Villalona, Michele Rockers, FNP  ibuprofen (ADVIL) 800 MG tablet Take 1 tablet (800 mg total) by mouth 3 (three) times daily. 07/26/19   Hall-Potvin, [place], PA-C  LO LOESTRIN FE 1 MG-10 MCG / 10 MCG tablet Take 1 tablet by mouth daily. Patient not taking: Reported on 08/19/2021 07/16/19   [provider]    Family History Family History  Problem Relation Age of Onset   Breast cancer Maternal Aunt    Cancer Paternal Grandfather    Cancer Maternal Aunt    Healthy Mother    Healthy Father     Social History Social History   Tobacco Use   Smoking status: Former   Smokeless tobacco: Never  Scientific laboratory technician Use: Never used  Substance Use Topics   Alcohol use: Yes   Drug use: No     Allergies   Patient has no known allergies.  Review of Systems Review of Systems Per HPI  Physical Exam Triage Vital Signs ED Triage Vitals  Enc Vitals Group     BP 08/19/21 0906 120/83     Pulse Rate 08/19/21 0906 76     Resp 08/19/21 0906 18     Temp 08/19/21 0906 98.3 F (36.8 C)     Temp Source 08/19/21 0906 Oral     SpO2 08/19/21 0906 98 %     Weight --      Height --      Head Circumference --      Peak Flow --      Pain Score 08/19/21 0903 6     Pain Loc --      Pain Edu? --      Excl. in [place]? --    No data found.  Updated Vital Signs BP 120/83 (BP Location: Left Arm)   Pulse 76   Temp 98.3 F (36.8 C) (Oral)   Resp 18   SpO2 98%   Visual Acuity Right Eye Distance:   Left Eye Distance:   Bilateral Distance:    Right Eye Near:   Left Eye Near:    Bilateral Near:     Physical Exam Constitutional:      General: She is not in acute distress.    Appearance: Normal appearance. She is not toxic-appearing or diaphoretic.  HENT:     Head:  Normocephalic and atraumatic.  Eyes:     Extraocular Movements: Extraocular movements intact.     Conjunctiva/sclera: Conjunctivae normal.  Cardiovascular:     Rate and Rhythm: Normal rate and regular rhythm.     Pulses: Normal pulses.     Heart sounds: Normal heart sounds.  Pulmonary:     Effort: Pulmonary effort is normal. No respiratory distress.     Breath sounds: Normal breath sounds.  Musculoskeletal:     Comments: Tenderness to palpation to right posterior knee and right posterior thigh.  No obvious swelling, discoloration, warmth, lacerations, abrasions noted.  Patient has full range of motion of right lower extremity.  Neurovascular intact.  Skin:    Comments: Mild nonpitting pedal and ankle edema noted.  No obvious discoloration, lacerations, abrasions, warmth noted.  Patient has full range of motion of ankle, foot, toes.  Capillary refill and pulses normal.  Neurological:     General: No focal deficit present.     Mental Status: She is alert and oriented to person, place, and time. Mental status is at baseline.  Psychiatric:        Mood and Affect: Mood normal.        Behavior: Behavior normal.        Thought Content: Thought content normal.        Judgment: Judgment normal.      UC Treatments / Results  Labs (all labs ordered are listed, but only abnormal results are displayed) Labs Reviewed  BASIC METABOLIC PANEL  CBC  BRAIN NATRIURETIC PEPTIDE  TSH  POCT URINALYSIS DIP (MANUAL ENTRY)    EKG   Radiology No results found.  Procedures Procedures (including critical care time)  Medications Ordered in UC Medications - No data to display  Initial Impression / Assessment and Plan / UC Course  I have reviewed the triage vital signs and the nursing notes.  Pertinent labs & imaging results that were available during my care of the patient were reviewed by me and considered in my medical decision making (see chart for details).  I do believe that  patient's two chief complaints are not related.  It appears that she may have a strained muscle strain that is causing right posterior thigh pain. No concern for DVT.  Advised patient that she may take muscle relaxer that is prescribed by other healthcare provider to help alleviate this.  Patient is not sure the name of this muscle relaxer but patient advised to take as prescribed.  Also advised patient to alternate ice and heat to affected area.  Discussed over-the-counter pain relievers and rest as well.  Patient's leg pain and pedal edema could be related to prolonged standing given recent waxing of floors.  Although, unsure if these are related.  Urinalysis was negative for any acute abnormalities.  Will send off BNP, BMP, TSH, CBC to assess for any other acute abnormalities.  Will prescribe low-dose and short course of Lasix to help alleviate fluid and patient was advised to elevate extremities.  I do think that a short course of low-dose of Lasix will be safe as patient does not have any obvious comorbidities that would indicate contraindication.  Do not think that chest x-ray is necessary given no adventitious lung sounds on exam.  Patient was given strict return and ER precautions.  Patient verbalized understanding and was agreeable with plan. Final Clinical Impressions(s) / UC Diagnoses   Final diagnoses:  Bilateral lower extremity edema  Right leg pain     Discharge Instructions      Your urine was clear.  Your blood work is pending.  We will call you if there are any abnormalities.  You have been prescribed a medication that will help alleviate fluid accumulation.  It will make you pee a lot so be aware of this.  You may take muscle relaxer that you have already been prescribed for leg pain as well.  Alternate ice and heat to affected area.  Please follow-up with primary care doctor for further evaluation and management of both of these ailments.  Also elevate extremities to help alleviate  swelling.    ED Prescriptions     Medication Sig Dispense Auth. Provider   furosemide (LASIX) 20 MG tablet Take 1 tablet (20 mg total) by mouth daily for 5 days. 5 tablet [place], Michele Rockers, [place]      PDMP not reviewed this encounter.   Teodora Medici, [place] 08/19/21 [place], [place], [place] 08/19/21 [phone]

## 2021-08-20 LAB — CBC
Hematocrit: 48 % — ABNORMAL HIGH (ref 34.0–46.6)
Hemoglobin: 15.9 g/dL (ref 11.1–15.9)
MCH: 30.1 pg (ref 26.6–33.0)
MCHC: 33.1 g/dL (ref 31.5–35.7)
MCV: 91 fL (ref 79–97)
Platelets: 264 10*3/uL (ref 150–450)
RBC: 5.29 x10E6/uL — ABNORMAL HIGH (ref 3.77–5.28)
RDW: 12.7 % (ref 11.7–15.4)
WBC: 6.3 10*3/uL (ref 3.4–10.8)

## 2021-08-20 LAB — BASIC METABOLIC PANEL
BUN/Creatinine Ratio: 17 (ref 9–23)
BUN: 13 mg/dL (ref 6–20)
CO2: 20 mmol/L (ref 20–29)
Calcium: 9.8 mg/dL (ref 8.7–10.2)
Chloride: 105 mmol/L (ref 96–106)
Creatinine, Ser: 0.77 mg/dL (ref 0.57–1.00)
Glucose: 86 mg/dL (ref 70–99)
Potassium: 4.2 mmol/L (ref 3.5–5.2)
Sodium: 141 mmol/L (ref 134–144)
eGFR: 110 mL/min/{1.73_m2} (ref >59)

## 2021-08-20 LAB — TSH: TSH: 1.36 u[IU]/mL (ref 0.450–4.500)

## 2021-08-20 LAB — BRAIN NATRIURETIC PEPTIDE

## 2021-09-16 ENCOUNTER — Ambulatory Visit: Payer: BC Managed Care – PPO | Admitting: Family

## 2022-02-12 ENCOUNTER — Encounter: Payer: Self-pay | Admitting: Emergency Medicine

## 2022-02-12 ENCOUNTER — Ambulatory Visit
Admission: EM | Admit: 2022-02-12 | Discharge: 2022-02-12 | Disposition: A | Discharge status: Home / Self Care | Payer: BC Managed Care – PPO | Attending: Internal Medicine | Admitting: Internal Medicine

## 2022-02-12 DIAGNOSIS — Z1152 Encounter for screening for COVID-19: Secondary | ICD-10-CM | POA: Insufficient documentation

## 2022-02-12 DIAGNOSIS — J029 Acute pharyngitis, unspecified: Secondary | ICD-10-CM | POA: Insufficient documentation

## 2022-02-12 DIAGNOSIS — J069 Acute upper respiratory infection, unspecified: Secondary | ICD-10-CM

## 2022-02-12 DIAGNOSIS — R058 Other specified cough: Secondary | ICD-10-CM | POA: Insufficient documentation

## 2022-02-12 LAB — RESP PANEL BY RT-PCR (FLU A&B, COVID) ARPGX2
Influenza A by PCR: POSITIVE — AB
Influenza B by PCR: NEGATIVE
SARS Coronavirus 2 by RT PCR: NEGATIVE

## 2022-02-12 LAB — POCT RAPID STREP A (OFFICE): Rapid Strep A Screen: NEGATIVE

## 2022-02-12 MED ORDER — ONDANSETRON 4 MG PO TBDP: 4.0000 mg | ORAL_TABLET | Freq: Three times a day (TID) | ORAL | 0 refills | Status: DC | PRN

## 2022-02-12 MED ORDER — BENZONATATE 100 MG PO CAPS: 100.0000 mg | ORAL_CAPSULE | Freq: Three times a day (TID) | ORAL | 0 refills | Status: DC | PRN

## 2022-02-12 NOTE — Discharge Instructions (Signed)
Rapid strep was negative.  Throat culture, COVID-19, flu test is pending.  You have a viral illness that should run its course and self resolve with symptomatic treatment.  I have prescribed you 2 medications to help alleviate symptoms.  One of them is a nausea medication.  Please ensure adequate fluid hydration with clear oral fluids including Gatorade, water, or Pedialyte.  Follow-up if symptoms persist or worsen.

## 2022-02-12 NOTE — ED Triage Notes (Signed)
Pt is present today with c/o body aches, HA, and hot flashes.   Onset~x3 days

## 2022-02-12 NOTE — ED Provider Notes (Signed)
[name]    CSN: [id number] Arrival date & time: 02/12/22  0901      History   Chief Complaint Chief Complaint  Patient presents with   Generalized Body Aches    Hot flashes    HPI Abigail Salas is a 26 y.o. female.   Patient presents with generalized body aches, headache, cough, nasal congestion, nausea, sore throat that started about 3 days ago.  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  Denies any fevers at home.  Denies chest pain, shortness of breath, ear pain, diarrhea, abdominal pain.  Patient denies history of asthma.  She has not taken any medications to help alleviate symptoms.     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 Authorizing Provider  benzonatate (TESSALON) 100 MG capsule Take 1 capsule (100 mg total) by mouth every 8 (eight) hours as needed for cough. 02/12/22  Yes Alishea Beaudin, Hildred Alamin E, FNP  ondansetron (ZOFRAN-ODT) 4 MG disintegrating tablet Take 1 tablet (4 mg total) by mouth every 8 (eight) hours as needed for nausea or vomiting. 02/12/22  Yes Oswaldo Conroy E, FNP  furosemide (LASIX) 20 MG tablet Take 1 tablet (20 mg total) by mouth daily for 5 days. 08/19/21 08/24/21  Teodora Medici, FNP  ibuprofen (ADVIL) 800 MG tablet Take 1 tablet (800 mg total) by mouth 3 (three) times daily. 07/26/19   Hall-Potvin, [place], PA-C  LO LOESTRIN FE 1 MG-10 MCG / 10 MCG tablet Take 1 tablet by mouth daily. Patient not taking: Reported on 08/19/2021 07/16/19   [provider]    Family History Family History  Problem Relation Age of Onset   Breast cancer Maternal Aunt    Cancer Paternal Grandfather    Cancer Maternal Aunt    Healthy Mother    Healthy Father     Social History Social History   Tobacco Use   Smoking status: Former   Smokeless tobacco: Never  Scientific laboratory technician Use: Never used  Substance Use Topics   Alcohol use: Yes   Drug use: No     Allergies   Patient has no known allergies.   Review of Systems Review of Systems Per HPI  Physical Exam Triage Vital Signs ED Triage Vitals  Enc Vitals Group     BP 02/12/22 1012 120/81     Pulse Rate 02/12/22 1012 66     Resp 02/12/22 1012 18     Temp 02/12/22 1012 98.3 F (36.8 C)     Temp src --      SpO2 02/12/22 1012 98 %     Weight --      Height --      Head Circumference --      Peak Flow --      Pain Score 02/12/22 1011 7     Pain Loc --      Pain Edu? --      Excl. in [place]? --    No data found.  Updated Vital Signs BP 120/81   Pulse 66   Temp 98.3 F (36.8 C)   Resp 18   SpO2 98%   Visual Acuity Right Eye Distance:   Left Eye Distance:   Bilateral Distance:    Right Eye Near:   Left Eye Near:    Bilateral Near:     Physical Exam Constitutional:      General: She is not in acute distress.    Appearance: Normal appearance. She is not toxic-appearing or diaphoretic.  HENT:     Head: Normocephalic and atraumatic.     Right Ear: Tympanic membrane and ear canal normal.     Left Ear: Tympanic membrane and ear canal normal.     Nose:  Congestion present.     Mouth/Throat:     Mouth: Mucous membranes are moist.     Pharynx: Posterior oropharyngeal erythema present.  Eyes:     Extraocular Movements: Extraocular movements intact.     Conjunctiva/sclera: Conjunctivae normal.     Pupils: Pupils are equal, round, and reactive to light.  Cardiovascular:     Rate and Rhythm: Normal rate and regular rhythm.     Pulses: Normal pulses.     Heart sounds: Normal heart sounds.  Pulmonary:     Effort: Pulmonary effort is normal. No respiratory distress.     Breath sounds: Normal breath sounds. No stridor. No wheezing, rhonchi or rales.  Abdominal:     General: Abdomen is flat. Bowel sounds are normal.     Palpations: Abdomen is soft.  Musculoskeletal:        General: Normal range of motion.     Cervical back: Normal range of motion.  Skin:    General: Skin is warm and dry.  Neurological:     General: No focal deficit present.     Mental Status: She is alert and oriented to person, place, and time. Mental status is at baseline.  Psychiatric:        Mood and Affect: Mood normal.        Behavior: Behavior normal.      UC Treatments / Results  Labs (all labs ordered are listed, but only abnormal results are displayed) Labs Reviewed  CULTURE, GROUP A STREP ([place])  RESP PANEL BY RT-PCR (FLU A&B, COVID) ARPGX2  POCT RAPID STREP A (OFFICE)    EKG   Radiology No results found.  Procedures Procedures (including critical care time)  Medications Ordered in UC Medications - No data to display  Initial Impression / Assessment and Plan / UC Course  I have reviewed the triage vital signs and the nursing notes.  Pertinent labs & imaging results that were available during my care of the patient were reviewed by me and considered in my  medical decision making (see chart for details).     Patient presents with symptoms likely from a viral upper respiratory infection. Differential includes bacterial pneumonia, sinusitis,  allergic rhinitis, COVID-19, flu, RSV. Do not suspect underlying cardiopulmonary process. Symptoms seem unlikely related to ACS, CHF or COPD exacerbations, pneumonia, pneumothorax. Patient is nontoxic appearing and not in need of emergent medical intervention.  Rapid strep is negative.  Throat culture, COVID-19, flu test pending.  Recommended symptom control with medications and supportive care.  Patient sent prescriptions.  Advised adequate fluid hydration as well.  Return if symptoms fail to improve in 1-2 weeks or you develop shortness of breath, chest pain, severe headache. Patient states understanding and is agreeable.  Discharged with PCP followup.  Final Clinical Impressions(s) / UC Diagnoses   Final diagnoses:  Viral upper respiratory tract infection with cough  Sore throat     Discharge Instructions      Rapid strep was negative.  Throat culture, COVID-19, flu test is pending.  You have a viral illness that should run its course and self resolve with symptomatic treatment.  I have prescribed you 2 medications to help alleviate symptoms.  One of them is a nausea medication.  Please ensure adequate fluid hydration with clear oral fluids including Gatorade, water, or Pedialyte.  Follow-up if symptoms persist or worsen.     ED Prescriptions     Medication Sig Dispense Auth. Provider   ondansetron (ZOFRAN-ODT) 4 MG disintegrating tablet Take 1 tablet (4 mg total) by mouth every 8 (eight) hours as needed for nausea or vomiting. 20 tablet [place], [place] E, [place]   benzonatate (TESSALON) 100 MG capsule Take 1 capsule (100 mg total) by mouth every 8 (eight) hours as needed for cough. 21 capsule [place], Michele Rockers, [place]      PDMP not reviewed this encounter.   Teodora Medici, [place] 02/12/22 1130

## 2022-02-15 LAB — CULTURE, GROUP A STREP (THRC)

## 2022-03-11 IMAGING — US US BREAST*R* LIMITED INC AXILLA
1 series · 12 of 12 positions shown · non-contrast
Comparison: None.

CLINICAL DATA: Palpable lump within the RIGHT breast. History of
LEFT breast fibroadenoma which was surgically excised because of
associated breast pain.

EXAM:
ULTRASOUND OF THE RIGHT BREAST

[Series 1: us breast*right* limited inc axilla · 0.06mm/px · 12 of 12 slices shown]
[im 1/12]
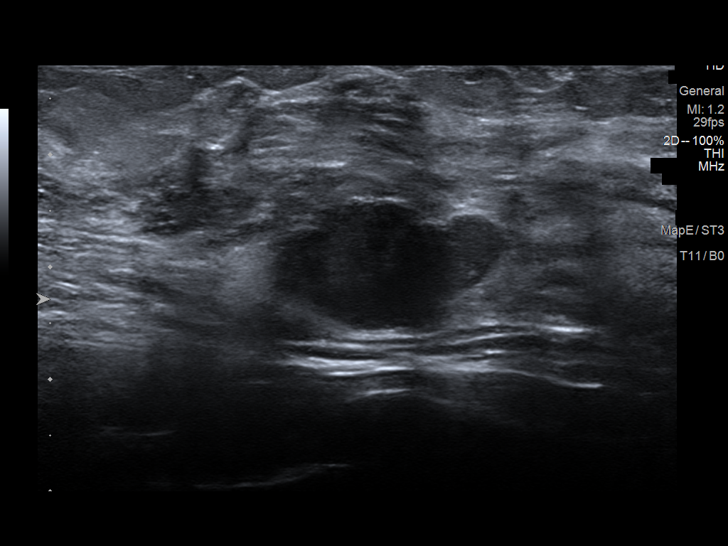
[im 2/12]
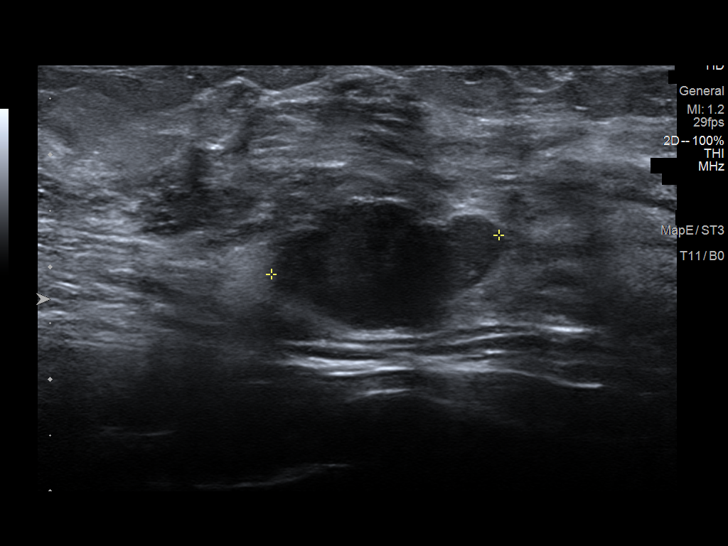
[im 3/12]
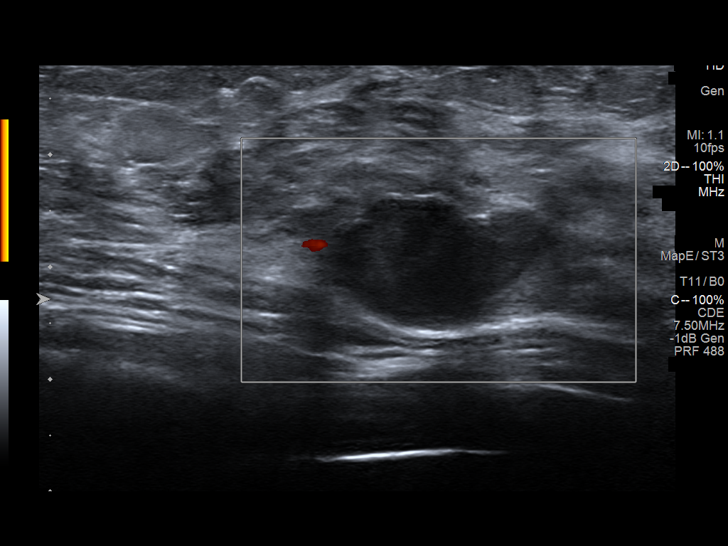
[im 4/12]
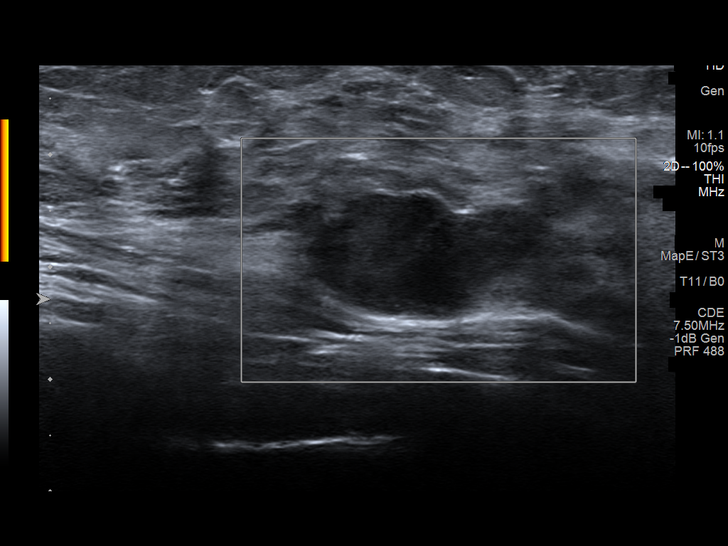
[im 5/12]
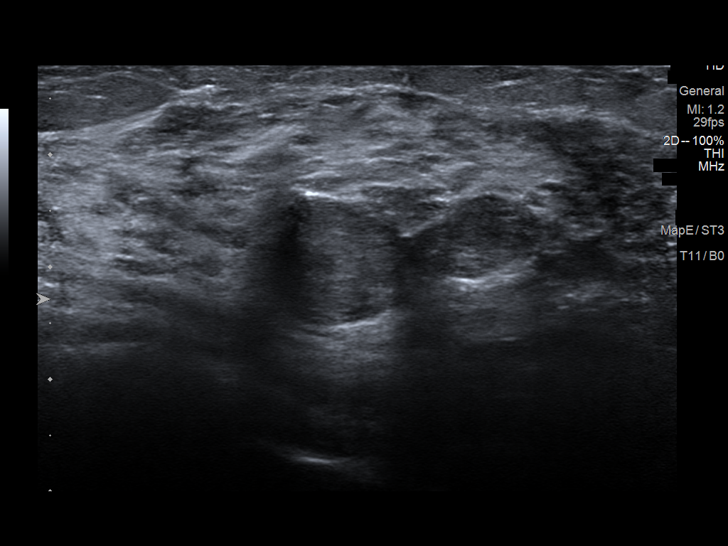
[im 6/12]
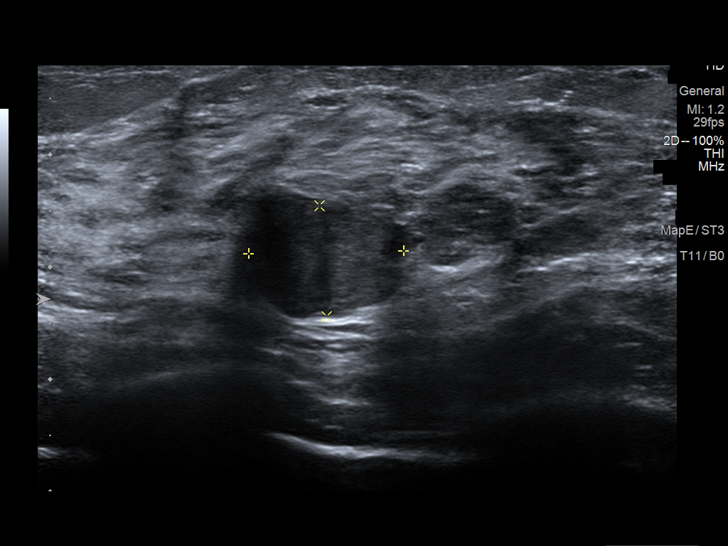
[im 7/12]
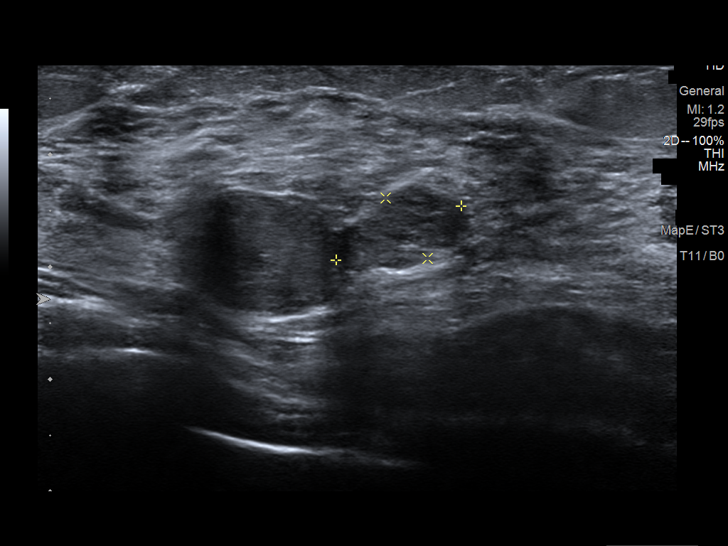
[im 8/12]
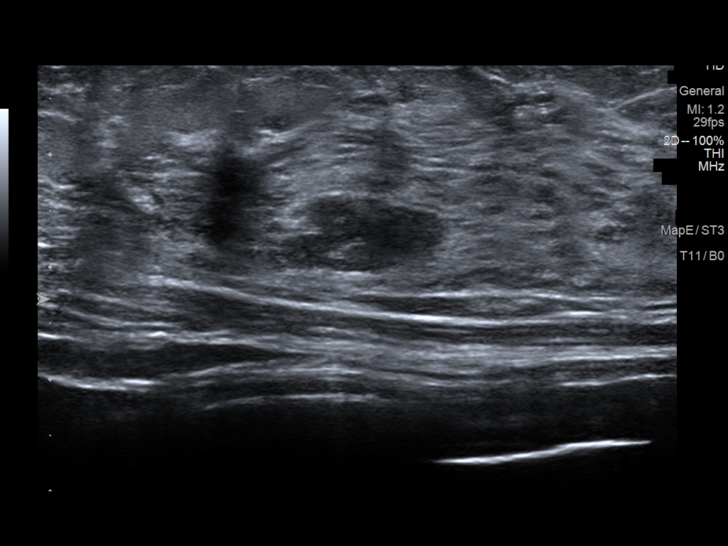
[im 9/12]
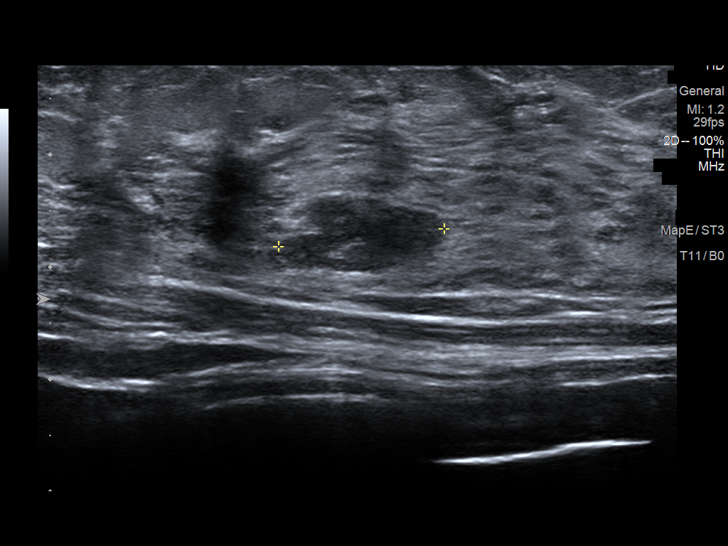
[im 10/12]
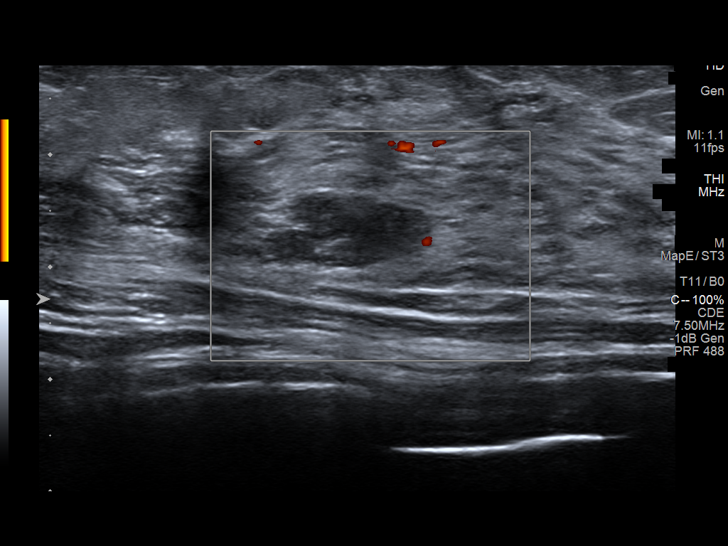
[im 11/12]
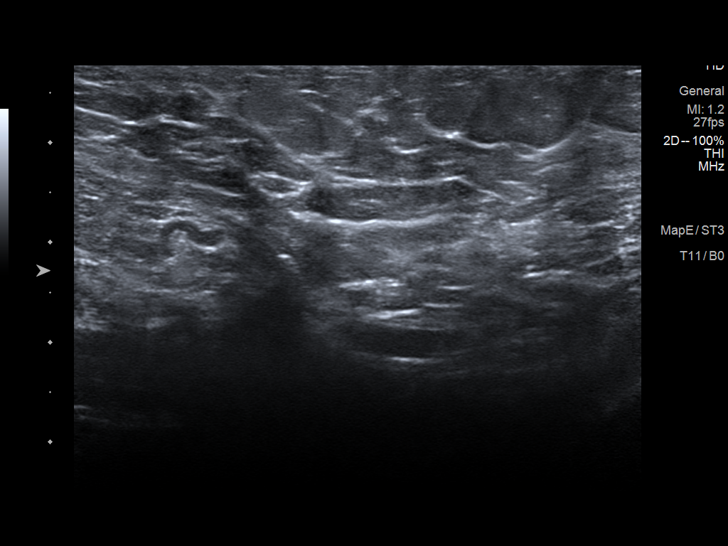
[im 12/12]
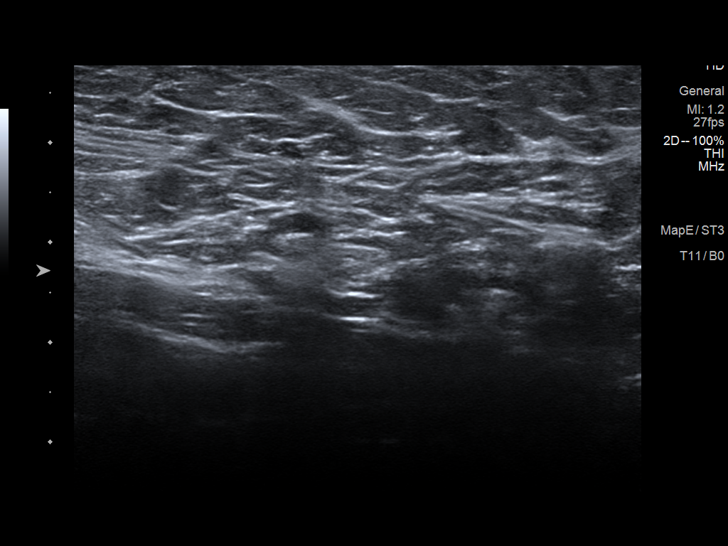

[12 of 12 positions shown; findings below may reference images not displayed]

FINDINGS: Targeted ultrasound is performed, showing an oval circumscribed
hypoechoic mass, versus 2 abutting masses, in the RIGHT breast at
the 10 o'clock axis, 8 cm from the nipple, overall measuring 2.1 cm
greatest dimension, corresponding to the area of clinical concern.
IMPRESSION: Probably benign mass, versus 2 abutting masses, within the RIGHT
breast at the 10 o'clock axis, 8 cm from the nipple, measuring
cm, most suggestive of benign fibroadenoma (s), corresponding to the
palpable area of concern. Recommend follow-up RIGHT breast
ultrasound in 6 months to ensure stability.

RECOMMENDATION:
RIGHT breast ultrasound in 6 months.

I have discussed the findings and recommendations with the patient.
If applicable, a reminder letter will be sent to the patient
regarding the next appointment.

BI-RADS CATEGORY  3: Probably benign.

## 2022-05-17 ENCOUNTER — Ambulatory Visit
Admission: EM | Admit: 2022-05-17 | Discharge: 2022-05-17 | Disposition: A | Discharge status: Home / Self Care | Payer: BC Managed Care – PPO | Attending: Physician Assistant | Admitting: Physician Assistant

## 2022-05-17 DIAGNOSIS — J029 Acute pharyngitis, unspecified: Secondary | ICD-10-CM | POA: Diagnosis not present

## 2022-05-17 DIAGNOSIS — R509 Fever, unspecified: Secondary | ICD-10-CM | POA: Diagnosis not present

## 2022-05-17 DIAGNOSIS — H6502 Acute serous otitis media, left ear: Secondary | ICD-10-CM | POA: Diagnosis not present

## 2022-05-17 DIAGNOSIS — J069 Acute upper respiratory infection, unspecified: Secondary | ICD-10-CM | POA: Diagnosis not present

## 2022-05-17 LAB — POCT INFLUENZA A/B
Influenza A, POC: NEGATIVE
Influenza B, POC: NEGATIVE

## 2022-05-17 LAB — POCT RAPID STREP A (OFFICE): Rapid Strep A Screen: NEGATIVE

## 2022-05-17 MED ORDER — FLUTICASONE PROPIONATE 50 MCG/ACT NA SUSP: 2.0000 | Freq: Every day | NASAL | 0 refills | Status: DC

## 2022-05-17 MED ORDER — AMOXICILLIN 500 MG PO CAPS: 500.0000 mg | ORAL_CAPSULE | Freq: Three times a day (TID) | ORAL | 0 refills | Status: DC

## 2022-05-17 NOTE — ED Provider Notes (Signed)
[name]    CSN: [id number] Arrival date & time: 05/17/22  0911      History   Chief Complaint Chief Complaint  Patient presents with   Cough    HPI Abigail Salas is a 27 y.o. female.   27 year old female presents with cough, congestion and fever.  Patient indicates that she had the flu about 2 weeks ago and this resolved.  She indicates for the past 5 days that she has been having upper respiratory congestion with rhinitis, postnasal drip, sinus pressure.  She indicates she is having left ear pressure, discomfort with decreased hearing.  She also has intermittent mild sore throat and painful swallowing.  She has been having fever which has been intermittent 100-101 over the past couple days, fatigue, lethargy, body aches and discomfort.  She also has mild chest congestion with intermittent cough, relating that the chest feels readily when she coughs.  She is concerned about having a reoccurrence of the flu.  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  She has been taking OTC medications with mild improvement of her symptoms.  She was without nausea or vomiting.   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;  Service: General;  Laterality: Left;   KNEE ARTHROSCOPY W/ ACL RECONSTRUCTION Right     OB History     Gravida  1   Para      Term      Preterm       AB      Living         SAB      IAB      Ectopic      Multiple      Live Births               Home Medications    Prior to Admission medications   Medication Sig Start Date End Date Taking? Authorizing Provider  amoxicillin (AMOXIL) 500 MG capsule Take 1 capsule (500 mg total) by mouth 3 (three) times daily. 05/17/22  Yes Nyoka Lint, PA-C  fluticasone [facility]) 50 MCG/ACT nasal spray Place 2 sprays into both nostrils daily. 05/17/22  Yes Nyoka Lint, PA-C  benzonatate (TESSALON) 100 MG capsule Take 1 capsule (100 mg total) by mouth every 8 (eight) hours as needed for cough. 02/12/22   Teodora Medici, FNP  furosemide (LASIX) 20 MG tablet Take 1 tablet (20 mg total) by mouth daily for 5 days. 08/19/21 08/24/21  Teodora Medici, FNP  ibuprofen (ADVIL) 800 MG tablet Take 1 tablet (800 mg total) by mouth 3 (three) times daily. 07/26/19   Hall-Potvin, [place], PA-C  LO LOESTRIN FE 1 MG-10 MCG / 10 MCG tablet Take 1 tablet by mouth daily. Patient not taking: Reported on 08/19/2021 07/16/19   [provider]  ondansetron (ZOFRAN-ODT) 4 MG disintegrating tablet Take 1 tablet (  4 mg total) by mouth every 8 (eight) hours as needed for nausea or vomiting. 02/12/22   Teodora Medici, FNP    Family History Family History  Problem Relation Age of Onset   Breast cancer Maternal Aunt    Cancer Paternal Grandfather    Cancer Maternal Aunt    Healthy Mother    Healthy Father     Social History Social History   Tobacco Use   Smoking status: Former   Smokeless tobacco: Never  Scientific laboratory technician Use: Never used  Substance Use Topics   Alcohol use: Yes   Drug use: No     Allergies   Patient has no known allergies.   Review of Systems Review of Systems  HENT:  Positive for ear pain (left ear) and sore throat.   Respiratory:  Positive for cough.      Physical Exam Triage Vital Signs ED Triage Vitals [05/17/22 0936]  Enc Vitals Group     BP 133/84     Pulse Rate  87     Resp 16     Temp 98 F (36.7 C)     Temp Source Oral     SpO2 99 %     Weight      Height      Head Circumference      Peak Flow      Pain Score 4     Pain Loc      Pain Edu?      Excl. in [place]?    No data found.  Updated Vital Signs BP 133/84 (BP Location: Right Arm)   Pulse 87   Temp 98 F (36.7 C) (Oral)   Resp 16   SpO2 99%   Visual Acuity Right Eye Distance:   Left Eye Distance:   Bilateral Distance:    Right Eye Near:   Left Eye Near:    Bilateral Near:     Physical Exam Constitutional:      Appearance: Normal appearance.  HENT:     Right Ear: Ear canal normal. Tympanic membrane is injected.     Left Ear: Ear canal normal. Tympanic membrane is erythematous.     Mouth/Throat:     Mouth: Mucous membranes are moist.     Pharynx: Posterior oropharyngeal erythema present. No oropharyngeal exudate.  Cardiovascular:     Rate and Rhythm: Normal rate and regular rhythm.     Heart sounds: Normal heart sounds.  Pulmonary:     Effort: Pulmonary effort is normal.     Breath sounds: Normal breath sounds and air entry. No wheezing, rhonchi or rales.  Lymphadenopathy:     Cervical: No cervical adenopathy.  Neurological:     Mental Status: She is alert.      UC Treatments / Results  Labs (all labs ordered are listed, but only abnormal results are displayed) Labs Reviewed  POCT INFLUENZA A/B  POCT RAPID STREP A (OFFICE)    EKG   Radiology No results found.  Procedures Procedures (including critical care time)  Medications Ordered in UC Medications - No data to display  Initial Impression / Assessment and Plan / UC Course  I have reviewed the triage vital signs and the nursing notes.  Pertinent labs & imaging results that were available during my care of the patient were reviewed by me and considered in my medical decision making (see chart for details).    Plan: The diagnosis will be treated with the following: 1.  Upper respiratory tract  infection: A.  Flonase nasal spray, 2 sprays each nostril once daily to help control congestion. 2.  Sore throat: A.  Advised to use Tylenol and ibuprofen along with lozenges and gargles to help control the sore throat. 3.  Otitis media left ear: A.  Advised to take amoxicillin 500 mg every 8 hours until completed to treat the ear infection. 4.  Fever: A.  Advised take Tylenol or ibuprofen as needed for fever. 5.  Advised follow-up PCP or return to urgent care if symptoms fail to improve. Final Clinical Impressions(s) / UC Diagnoses   Final diagnoses:  Acute upper respiratory infection  Sore throat  Non-recurrent acute serous otitis media of left ear  Fever, unspecified     Discharge Instructions      Advised to use ibuprofen or Tylenol as needed for fever body aches. Advise use of Flonase nasal spray, 2 sprays each nostril once a day to help decrease sinus congestion and drainage. Advised take the amoxicillin 500 mg every 8 hours until completed to treat the ear infection. Advised to use OTC cough preparations to help control cough and congestion.  Advised follow-up PCP return to urgent care as needed.     ED Prescriptions     Medication Sig Dispense Auth. Provider   amoxicillin (AMOXIL) 500 MG capsule Take 1 capsule (500 mg total) by mouth 3 (three) times daily. 21 capsule Nyoka Lint, PA-C   fluticasone [facility]) 50 MCG/ACT nasal spray Place 2 sprays into both nostrils daily. 9.9 mL Nyoka Lint, PA-C      PDMP not reviewed this encounter.   Nyoka Lint, PA-C 05/17/22 1027

## 2022-05-17 NOTE — Discharge Instructions (Signed)
Advised to use ibuprofen or Tylenol as needed for fever body aches. Advise use of Flonase nasal spray, 2 sprays each nostril once a day to help decrease sinus congestion and drainage. Advised take the amoxicillin 500 mg every 8 hours until completed to treat the ear infection. Advised to use OTC cough preparations to help control cough and congestion.  Advised follow-up PCP return to urgent care as needed.

## 2022-05-17 NOTE — ED Triage Notes (Signed)
Pt c/o cough, sneezing, nasal congestion, fatigue,   Onset ~ Tuesday

## 2022-08-03 ENCOUNTER — Encounter: Payer: Self-pay | Admitting: Radiology

## 2022-08-03 ENCOUNTER — Ambulatory Visit: Payer: BC Managed Care – PPO | Admitting: Family

## 2022-08-04 ENCOUNTER — Encounter: Payer: Self-pay | Admitting: Radiology

## 2022-11-15 ENCOUNTER — Encounter (HOSPITAL_COMMUNITY): Payer: Self-pay | Admitting: *Deleted

## 2022-11-15 ENCOUNTER — Ambulatory Visit (HOSPITAL_COMMUNITY)
Admission: EM | Admit: 2022-11-15 | Discharge: 2022-11-15 | Disposition: A | Discharge status: Home / Self Care | Payer: Worker's Compensation | Attending: Family Medicine | Admitting: Family Medicine

## 2022-11-15 ENCOUNTER — Ambulatory Visit (INDEPENDENT_AMBULATORY_CARE_PROVIDER_SITE_OTHER): Payer: Worker's Compensation

## 2022-11-15 ENCOUNTER — Other Ambulatory Visit: Payer: Self-pay

## 2022-11-15 DIAGNOSIS — S62639A Displaced fracture of distal phalanx of unspecified finger, initial encounter for closed fracture: Secondary | ICD-10-CM | POA: Diagnosis not present

## 2022-11-15 MED ORDER — KETOROLAC TROMETHAMINE 10 MG PO TABS: 10.0000 mg | ORAL_TABLET | Freq: Four times a day (QID) | ORAL | 0 refills | Status: AC | PRN

## 2022-11-15 MED ORDER — KETOROLAC TROMETHAMINE 30 MG/ML IJ SOLN
30.0000 mg | Freq: Once | INTRAMUSCULAR | Status: AC
  Administered 2022-11-15: 30 mg via INTRAMUSCULAR

## 2022-11-15 MED ORDER — HYDROCODONE-ACETAMINOPHEN 5-325 MG PO TABS: 1.0000 | ORAL_TABLET | Freq: Four times a day (QID) | ORAL | 0 refills | Status: AC | PRN

## 2022-11-15 MED ORDER — KETOROLAC TROMETHAMINE 30 MG/ML IJ SOLN
INTRAMUSCULAR | Status: AC
  Filled 2022-11-15: qty 1

## 2022-11-15 NOTE — ED Provider Notes (Signed)
[facility]    CSN: [id number] Arrival date & time: 11/15/22  1638      History   Chief Complaint Chief Complaint  Patient presents with   Finger Injury    HPI Abigail Salas is a 27 y.o. female.   HPI Here for pain of her left distal index finger.  Today when she was at work she was folding up chairs and a folding chair caught her left index finger and crushed it.  She now has pain and swelling of the distal left index finger.  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 Authorizing Provider  HYDROcodone-acetaminophen (NORCO/VICODIN) 5-325 MG tablet Take 1 tablet by mouth every 6 (six) hours as needed (pain). 11/15/22  Yes Zenia Resides, MD  ketorolac (TORADOL) 10 MG tablet Take 1 tablet (10 mg total) by mouth every 6 (six) hours as needed (pain). 11/15/22  Yes Geran Haithcock, Janace Aris, MD    Family History Family History  Problem Relation Age of Onset   Breast cancer Maternal Aunt    Cancer Paternal Grandfather    Cancer Maternal Aunt    Healthy Mother    Healthy Father     Social History Social  History   Tobacco Use   Smoking status: Former   Smokeless tobacco: Never  Vaping Use   Vaping status: Never Used  Substance Use Topics   Alcohol use: Yes   Drug use: No     Allergies   Patient has no known allergies.   Review of Systems Review of Systems   Physical Exam Triage Vital Signs ED Triage Vitals  Encounter Vitals Group     BP 11/15/22 1833 115/76     Systolic BP Percentile --      Diastolic BP Percentile --      Pulse Rate 11/15/22 1833 64     Resp 11/15/22 1833 18     Temp 11/15/22 1833 98.3 F (36.8 C)     Temp src --      SpO2 11/15/22 1833 98 %     Weight --      Height --      Head Circumference --      Peak Flow --      Pain Score 11/15/22 1831 6  Pain Loc --      Pain Education --      Exclude from Growth Chart --    No data found.  Updated Vital Signs BP 115/76   Pulse 64   Temp 98.3 F (36.8 C)   Resp 18   SpO2 98%   Visual Acuity Right Eye Distance:   Left Eye Distance:   Bilateral Distance:    Right Eye Near:   Left Eye Near:    Bilateral Near:     Physical Exam Vitals reviewed.  Constitutional:      General: She is not in acute distress.    Appearance: She is not toxic-appearing.  Musculoskeletal:     Cervical back: Neck supple.     Comments: There is swelling and bruising of the distal left index finger.  There is just a little bit of hematoma under the distal left nail.  Capillary refill is normal and it is very tender  Skin:    Capillary Refill: Capillary refill takes less than 2 seconds.     Coloration: Skin is not jaundiced or pale.  Neurological:     General: No focal deficit present.     Mental Status: She is alert and oriented to person, place, and time.  Psychiatric:        Behavior: Behavior normal.      UC Treatments / Results  Labs (all labs ordered are listed, but only abnormal results are displayed) Labs Reviewed - No data to display  EKG   Radiology No results  found.  Procedures Procedures (including critical care time)  Medications Ordered in UC Medications  ketorolac (TORADOL) 30 MG/ML injection 30 mg (has no administration in time range)    Initial Impression / Assessment and Plan / UC Course  I have reviewed the triage vital signs and the nursing notes.  Pertinent labs & imaging results that were available during my care of the patient were reviewed by me and considered in my medical decision making (see chart for details).        By my review there is a fracture at the tuft of the distal phalanx. Finger guard splint was placed and Toradol injection is given here.  Toradol tablets and hydrocodone are sent to the pharmacy. Ice and elevation tonight  She is given contact information for orthopedics/hand   Final Clinical Impressions(s) / UC Diagnoses   Final diagnoses:  Closed fracture of tuft of distal phalanx of finger     Discharge Instructions      There is a tuft fracture at the very end of your index finger.  The radiologist will also read your x-ray, and if their interpretation differs significantly from mine, we will call you.  You have been given a shot of Toradol 30 mg today.  Ketorolac 10 mg tablets--take 1 tablet every 6 hours as needed for pain.  This is the same medicine that is in the shot we just gave you  Hydrocodone 5 mg--1 tablet every 6 hours as needed for pain.  This is best taken with food.  It can cause sleepiness or dizziness       ED Prescriptions     Medication Sig Dispense Auth. Provider   ketorolac (TORADOL) 10 MG tablet Take 1 tablet (10 mg total) by mouth every 6 (six) hours as needed (pain). 20 tablet Aunna Snooks, Janace Aris, MD   HYDROcodone-acetaminophen (NORCO/VICODIN) 5-325 MG tablet Take 1 tablet by mouth every 6 (six) hours as needed (pain). 12 tablet  Zenia Resides, MD      I have reviewed the PDMP during this encounter.   Zenia Resides, MD 11/15/22 [phone]

## 2022-11-15 NOTE — Discharge Instructions (Signed)
There is a tuft fracture at the very end of your index finger.  The radiologist will also read your x-ray, and if their interpretation differs significantly from mine, we will call you.  You have been given a shot of Toradol 30 mg today.  Ketorolac 10 mg tablets--take 1 tablet every 6 hours as needed for pain.  This is the same medicine that is in the shot we just gave you  Hydrocodone 5 mg--1 tablet every 6 hours as needed for pain.  This is best taken with food.  It can cause sleepiness or dizziness

## 2022-11-15 NOTE — ED Triage Notes (Signed)
Pt reports at 3PM today she was putting out metal folding Chairs. The chair folded up and mashed her Lt index finger.

## 2022-12-27 ENCOUNTER — Ambulatory Visit
Admission: EM | Admit: 2022-12-27 | Discharge: 2022-12-27 | Disposition: A | Discharge status: Home / Self Care | Payer: BC Managed Care – PPO

## 2022-12-27 ENCOUNTER — Encounter: Payer: Self-pay | Admitting: Emergency Medicine

## 2022-12-27 ENCOUNTER — Other Ambulatory Visit: Payer: Self-pay

## 2022-12-27 DIAGNOSIS — H0289 Other specified disorders of eyelid: Secondary | ICD-10-CM

## 2022-12-27 NOTE — ED Triage Notes (Signed)
Reports rubbing left eye approximately one hour ago.  Patient initially felt like left eye was burning.  Now, eye is "fine", but eye lids are burning.  Left eye lid may be swollen.  Patient had cut a green bell pepper just prior to this occurring.   Patient does not wear contacts .  Patient does wear glasses.  Patient did flush her eye with water.  Left eye looks unremarkable.  Eyelids appear swollen  Has not used any medications for treatment of eyes

## 2022-12-27 NOTE — ED Provider Notes (Signed)
[name]    CSN: [id number] Arrival date & time: 12/27/22  1226      History   Chief Complaint No chief complaint on file.   HPI Abigail Salas is a 27 y.o. female.   Patient presents today after rubbing her left upper eyelid with her hand approximately 1 hour ago and now having subsequent pain.  Reports that she was cooking directly prior to this happening but washed her hands before she rubbed her eyes.  Denies any other direct trauma or foreign body to the eye.  Denies blurry vision.  She does not wear contacts or glasses.     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    Past Surgical History:  Procedure Laterality Date   BREAST EXCISIONAL BIOPSY Left 04/04/2017   Fibroadenoma   BREAST LUMPECTOMY Left 04/04/2017   Procedure: EXCISION OF LEFT BREAST FIBROADENOMA;  Surgeon: Claud Kelp, MD;  Location: [facility];  Service: General;  Laterality: Left;   KNEE ARTHROSCOPY W/ ACL RECONSTRUCTION Right     OB History     Gravida  1   Para      Term      Preterm      AB      Living         SAB      IAB      Ectopic      Multiple      Live Births               Home Medications    Prior to Admission medications   Medication Sig Start Date End Date Taking? Authorizing Provider  HYDROcodone-acetaminophen (NORCO/VICODIN) 5-325 MG tablet Take 1 tablet by mouth every 6 (six) hours as needed (pain). Patient not taking: Reported on 12/27/2022 11/15/22   Zenia Resides, MD  ketorolac (TORADOL) 10 MG tablet Take 1 tablet (10 mg total) by mouth every 6 (six) hours as needed (pain). Patient not taking: Reported on 12/27/2022 11/15/22   Zenia Resides, MD    Family History Family History  Problem Relation Age of Onset   Breast  cancer Maternal Aunt    Cancer Paternal Grandfather    Cancer Maternal Aunt    Healthy Mother    Healthy Father     Social History Social History   Tobacco Use   Smoking status: Former   Smokeless tobacco: Never  Vaping Use   Vaping status: Never Used  Substance Use Topics   Alcohol use: Yes   Drug use: No     Allergies   Patient has no known allergies.   Review of Systems Review of Systems Per HPI  Physical Exam Triage Vital Signs ED Triage Vitals  Encounter Vitals Group     BP 12/27/22 1304 110/74     Systolic BP Percentile --      Diastolic BP Percentile --      Pulse Rate 12/27/22 1304 65     Resp 12/27/22 1304 18     Temp 12/27/22 1304 98.2 F (36.8 C)     Temp Source 12/27/22 1304 Oral     SpO2 12/27/22 1304 99 %     Weight --      Height --      Head  Circumference --      Peak Flow --      Pain Score 12/27/22 1301 2     Pain Loc --      Pain Education --      Exclude from Growth Chart --    No data found.  Updated Vital Signs BP 110/74 (BP Location: Left Arm) Comment (BP Location): large cuff  Pulse 65   Temp 98.2 F (36.8 C) (Oral)   Resp 18   SpO2 99%   Visual Acuity Right Eye Distance:   Left Eye Distance:   Bilateral Distance:    Right Eye Near:   Left Eye Near:    Bilateral Near:     Physical Exam Constitutional:      General: She is not in acute distress.    Appearance: Normal appearance. She is not toxic-appearing or diaphoretic.  HENT:     Head: Normocephalic and atraumatic.  Eyes:     General: Lids are normal. Lids are everted, no foreign bodies appreciated. Vision grossly intact. Gaze aligned appropriately.     Extraocular Movements: Extraocular movements intact.     Conjunctiva/sclera: Conjunctivae normal.     Pupils: Pupils are equal, round, and reactive to light.     Comments: Eyes and eyelids appear completely normal with no swelling, discoloration, abrasions, lacerations noted.  Pulmonary:     Effort: Pulmonary  effort is normal.  Neurological:     General: No focal deficit present.     Mental Status: She is alert and oriented to person, place, and time. Mental status is at baseline.  Psychiatric:        Mood and Affect: Mood normal.        Behavior: Behavior normal.        Thought Content: Thought content normal.        Judgment: Judgment normal.      UC Treatments / Results  Labs (all labs ordered are listed, but only abnormal results are displayed) Labs Reviewed - No data to display  EKG   Radiology No results found.  Procedures Procedures (including critical care time)  Medications Ordered in UC Medications - No data to display  Initial Impression / Assessment and Plan / UC Course  I have reviewed the triage vital signs and the nursing notes.  Pertinent labs & imaging results that were available during my care of the patient were reviewed by me and considered in my medical decision making (see chart for details).     Eyes are completely normal on exam with no significant complications including no swelling, discoloration, lacerations, abrasions noted.  Patient reporting discomfort is present to the left upper eyelid.  Encouraged her to use cool compresses for comfort as needed and to follow-up if symptoms persist or worsen.  Given she is complaining of only eyelid pain, do not think fluorescein stain is necessary as there is a low suspicion for any corneal abrasion.  Visual acuity appears normal.  Patient verbalized understanding and was agreeable with plan. Final Clinical Impressions(s) / UC Diagnoses   Final diagnoses:  Eyelid pain, left     Discharge Instructions      May use very minimal cool compresses intermittently to decrease inflammation associated with your eyelid pain.  Follow-up if symptoms persist or worsen.    ED Prescriptions   None    PDMP not reviewed this encounter.   Gustavus Bryant, [place] 12/27/22 1345

## 2022-12-27 NOTE — Discharge Instructions (Signed)
May use very minimal cool compresses intermittently to decrease inflammation associated with your eyelid pain.  Follow-up if symptoms persist or worsen.

## 2023-01-24 ENCOUNTER — Ambulatory Visit
Admission: EM | Admit: 2023-01-24 | Discharge: 2023-01-24 | Disposition: A | Discharge status: Home / Self Care | Payer: BC Managed Care – PPO | Attending: Physician Assistant | Admitting: Physician Assistant

## 2023-01-24 DIAGNOSIS — J329 Chronic sinusitis, unspecified: Secondary | ICD-10-CM

## 2023-01-24 DIAGNOSIS — G43009 Migraine without aura, not intractable, without status migrainosus: Secondary | ICD-10-CM

## 2023-01-24 DIAGNOSIS — B9789 Other viral agents as the cause of diseases classified elsewhere: Secondary | ICD-10-CM

## 2023-01-24 MED ORDER — CETIRIZINE HCL 10 MG PO TABS: 10.0000 mg | ORAL_TABLET | Freq: Every day | ORAL | 0 refills | Status: AC

## 2023-01-24 MED ORDER — DEXAMETHASONE SODIUM PHOSPHATE 10 MG/ML IJ SOLN
10.0000 mg | Freq: Once | INTRAMUSCULAR | Status: AC
  Administered 2023-01-24: 10 mg via INTRAMUSCULAR

## 2023-01-24 MED ORDER — KETOROLAC TROMETHAMINE 30 MG/ML IJ SOLN
30.0000 mg | Freq: Once | INTRAMUSCULAR | Status: AC
  Administered 2023-01-24: 30 mg via INTRAMUSCULAR

## 2023-01-24 MED ORDER — FLUTICASONE PROPIONATE 50 MCG/ACT NA SUSP: 1.0000 | Freq: Every day | NASAL | 0 refills | Status: AC

## 2023-01-24 NOTE — ED Triage Notes (Signed)
Patient states she had a fever Thursday-Sunday of 100.4, congestion, migraine and body aches. No treatment used.

## 2023-01-24 NOTE — ED Provider Notes (Signed)
[name]    CSN: [id number] Arrival date & time: 01/24/23  0940      History   Chief Complaint No chief complaint on file.   HPI Abigail Salas is a 27 y.o. female.   Patient presents today with a 5-day history of URI symptoms.  She reports low-grade fever, congestion, headache, body aches, sinus pressure.  She denies any chest pain, shortness of breath, significant cough, nausea, vomiting.  She does have a history of migraines and states current symptoms are similar to previous episodes of this condition likely triggered by illness.  She has tried Tylenol and ibuprofen without improvement of symptoms.  She has had COVID several times with the last episode approximately 6 months ago.  She did take an at-home COVID test when symptoms began and this was negative.  She denies any history of allergies, asthma, COPD, smoking.  She has no concern for pregnancy.  Denies any recent antibiotics or steroids.  She is having difficulty with daily activities as result of symptoms.  Headache pain is rated 6/7 on a 0-10 pain scale, described as pressure/throbbing around her sinuses, no alleviating factors identified.  This is not the worst headache of her life.  She does report associated photophobia but does not have any visual disturbance, vomiting, focal weakness, dysarthria.    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    Past Surgical History:  Procedure Laterality Date   BREAST EXCISIONAL BIOPSY Left 04/04/2017   Fibroadenoma   BREAST LUMPECTOMY Left 04/04/2017   Procedure: EXCISION OF LEFT BREAST FIBROADENOMA;  Surgeon: Claud Kelp, MD;  Location: [facility];  Service: General;  Laterality: Left;   KNEE ARTHROSCOPY W/ ACL RECONSTRUCTION Right     OB History      Gravida  1   Para      Term      Preterm      AB      Living         SAB      IAB      Ectopic      Multiple      Live Births               Home Medications    Prior to Admission medications   Medication Sig Start Date End Date Taking? Authorizing Provider  cetirizine (ZYRTEC ALLERGY) 10 MG tablet Take 1 tablet (10 mg total) by mouth at bedtime. 01/24/23  Yes Angelmarie Ponzo K, PA-C  fluticasone (FLONASE) 50 MCG/ACT nasal spray Place 1 spray into both nostrils daily. 01/24/23  Yes Ashlie Mcmenamy, Noberto Retort, PA-C  HYDROcodone-acetaminophen (NORCO/VICODIN) 5-325 MG tablet Take 1 tablet by mouth every 6 (six) hours as needed (pain). Patient not taking: Reported on 12/27/2022 11/15/22   Zenia Resides, MD  ketorolac (TORADOL) 10 MG tablet Take 1 tablet (10 mg total) by mouth every 6 (six) hours as needed (pain). Patient not taking: Reported on 12/27/2022 11/15/22   Zenia Resides, MD    Family History Family History  Problem Relation Age of Onset   Breast cancer Maternal Aunt    Cancer Paternal Grandfather    Cancer Maternal Aunt    Healthy Mother    Healthy Father     Social History Social History  Tobacco Use   Smoking status: Former   Smokeless tobacco: Never  Vaping Use   Vaping status: Never Used  Substance Use Topics   Alcohol use: Yes   Drug use: No     Allergies   Patient has no known allergies.   Review of Systems Review of Systems  Constitutional:  Positive for activity change and fever. Negative for appetite change and fatigue.  HENT:  Positive for congestion, postnasal drip and sinus pressure. Negative for sneezing and sore throat.   Eyes:  Positive for photophobia. Negative for visual disturbance.  Respiratory:  Negative for cough and shortness of breath.   Cardiovascular:  Negative for chest pain.  Gastrointestinal:  Negative for abdominal pain, diarrhea, nausea and vomiting.  Musculoskeletal:  Positive for arthralgias and myalgias.   Neurological:  Positive for headaches. Negative for dizziness, syncope, facial asymmetry, speech difficulty, weakness, light-headedness and numbness.     Physical Exam Triage Vital Signs ED Triage Vitals  Encounter Vitals Group     BP 01/24/23 1110 117/78     Systolic BP Percentile --      Diastolic BP Percentile --      Pulse Rate 01/24/23 1110 67     Resp 01/24/23 1110 16     Temp 01/24/23 1110 98.7 F (37.1 C)     Temp Source 01/24/23 1110 Oral     SpO2 01/24/23 1110 96 %     Weight 01/24/23 1109 188 lb (85.3 kg)     Height 01/24/23 1109 5' (1.524 m)     Head Circumference --      Peak Flow --      Pain Score 01/24/23 1109 4     Pain Loc --      Pain Education --      Exclude from Growth Chart --    No data found.  Updated Vital Signs BP 117/78 (BP Location: Left Arm)   Pulse 67   Temp 98.7 F (37.1 C) (Oral)   Resp 16   Ht 5' (1.524 m)   Wt 188 lb (85.3 kg)   SpO2 96%   BMI 36.72 kg/m   Visual Acuity Right Eye Distance:   Left Eye Distance:   Bilateral Distance:    Right Eye Near:   Left Eye Near:    Bilateral Near:     Physical Exam Vitals reviewed.  Constitutional:      General: She is awake. She is not in acute distress.    Appearance: Normal appearance. She is well-developed. She is not ill-appearing.     Comments: Very pleasant female appears stated age in no acute distress sitting comfortably in exam room  HENT:     Head: Normocephalic and atraumatic.     Right Ear: Tympanic membrane, ear canal and external ear normal. Tympanic membrane is not erythematous or bulging.     Left Ear: Tympanic membrane, ear canal and external ear normal. Tympanic membrane is not erythematous or bulging.     Nose:     Right Sinus: Maxillary sinus tenderness and frontal sinus tenderness present.     Left Sinus: Maxillary sinus tenderness and frontal sinus tenderness present.     Mouth/Throat:     Pharynx: Uvula midline. No oropharyngeal exudate or posterior  oropharyngeal erythema.  Cardiovascular:     Rate and Rhythm: Normal rate and regular rhythm.     Heart sounds: Normal heart sounds, S1 normal and S2 normal. No murmur heard. Pulmonary:     Effort: Pulmonary  effort is normal.     Breath sounds: Normal breath sounds. No wheezing, rhonchi or rales.     Comments: Clear to auscultation bilaterally Neurological:     General: No focal deficit present.     Mental Status: She is alert and oriented to person, place, and time.     Cranial Nerves: Cranial nerves 2-12 are intact.     Motor: Motor function is intact.     Comments: Cranial nerves II through XII grossly intact.  No focal neurological defect on exam.  Psychiatric:        Behavior: Behavior is cooperative.      UC Treatments / Results  Labs (all labs ordered are listed, but only abnormal results are displayed) Labs Reviewed - No data to display  EKG   Radiology No results found.  Procedures Procedures (including critical care time)  Medications Ordered in UC Medications  ketorolac (TORADOL) 30 MG/ML injection 30 mg (30 mg Intramuscular Given 01/24/23 1258)  dexamethasone (DECADRON) injection 10 mg (10 mg Intramuscular Given 01/24/23 1259)    Initial Impression / Assessment and Plan / UC Course  I have reviewed the triage vital signs and the nursing notes.  Pertinent labs & imaging results that were available during my care of the patient were reviewed by me and considered in my medical decision making (see chart for details).     Patient is well-appearing, afebrile, nontoxic, nontachycardic.  No indication for viral testing as she has been symptomatic for over 5 days and this would not change our management.  Chest x-ray was deferred as she had no adventitious lung sounds on exam, complaints of cough, with oxygen saturation of 96%.  No evidence of acute infection on physical exam that would warrant initiation of antibiotics.  I suspect that viral sinusitis has triggered  migraine/sinus headache.  She was given Toradol 30 mg and 10 mg of dexamethasone in clinic with improvement of pain; pain improved to a 2 on a 0-10 pain scale.  She was encouraged to use over-the-counter medications including fluticasone nasal spray, nasal single/sinus rinses, antihistamines to help manage her symptoms.  If her symptoms are not improving or if anything worsens she is to return for reevaluation.  She is to rest and drink plenty of fluids.  We discussed that if anything worsens or changes and she has worsening headache, worsening of her life, high fever, cough, shortness of breath, chest pain she needs to be seen emergently.  Strict return precautions given.  Work excuse note provided.  Final Clinical Impressions(s) / UC Diagnoses   Final diagnoses:  Viral sinusitis  Migraine without aura and without status migrainosus, not intractable     Discharge Instructions      I am glad that you are feeling better after the medication.  Please do not take any NSAIDs for the next 24 hours since we gave you ketorolac in clinic (aspirin, ibuprofen/Advil, naproxen/Aleve).  You can use Tylenol/acetaminophen.  Start cetirizine at night and use fluticasone nasal spray.  You can also use nasal saline and sinus rinses for additional symptom relief.  Make sure that you rest and drink plenty of fluid.  If your symptoms are not improving within a few days or if anything worsens please return for reevaluation.     ED Prescriptions     Medication Sig Dispense Auth. Provider   fluticasone (FLONASE) 50 MCG/ACT nasal spray Place 1 spray into both nostrils daily. 16 g Jayven Naill K, PA-C   cetirizine (ZYRTEC ALLERGY)  10 MG tablet Take 1 tablet (10 mg total) by mouth at bedtime. 30 tablet Yoon Barca, Noberto Retort, PA-C      PDMP not reviewed this encounter.   Jeani Hawking, PA-C 01/24/23 1329

## 2023-01-24 NOTE — Discharge Instructions (Signed)
I am glad that you are feeling better after the medication.  Please do not take any NSAIDs for the next 24 hours since we gave you ketorolac in clinic (aspirin, ibuprofen/Advil, naproxen/Aleve).  You can use Tylenol/acetaminophen.  Start cetirizine at night and use fluticasone nasal spray.  You can also use nasal saline and sinus rinses for additional symptom relief.  Make sure that you rest and drink plenty of fluid.  If your symptoms are not improving within a few days or if anything worsens please return for reevaluation.

## 2023-04-16 IMAGING — US US BREAST*R* LIMITED INC AXILLA
1 series · 5 of 5 positions shown · non-contrast
Comparison: Previous exam(s).

CLINICAL DATA: One year interval follow-up of a likely benign mass
in the UPPER OUTER QUADRANT the RIGHT breast, possibly a
fibroadenoma. The patient had a symptomatic benign fibroadenoma
excised from the LEFT breast two years ago.

Family history of breast cancer in a maternal aunt.
EXAM:
ULTRASOUND OF THE RIGHT BREAST

[Series 1: us breast*right* limited inc axilla · 0.08mm/px · 5 of 5 slices shown]
[im 1/5]
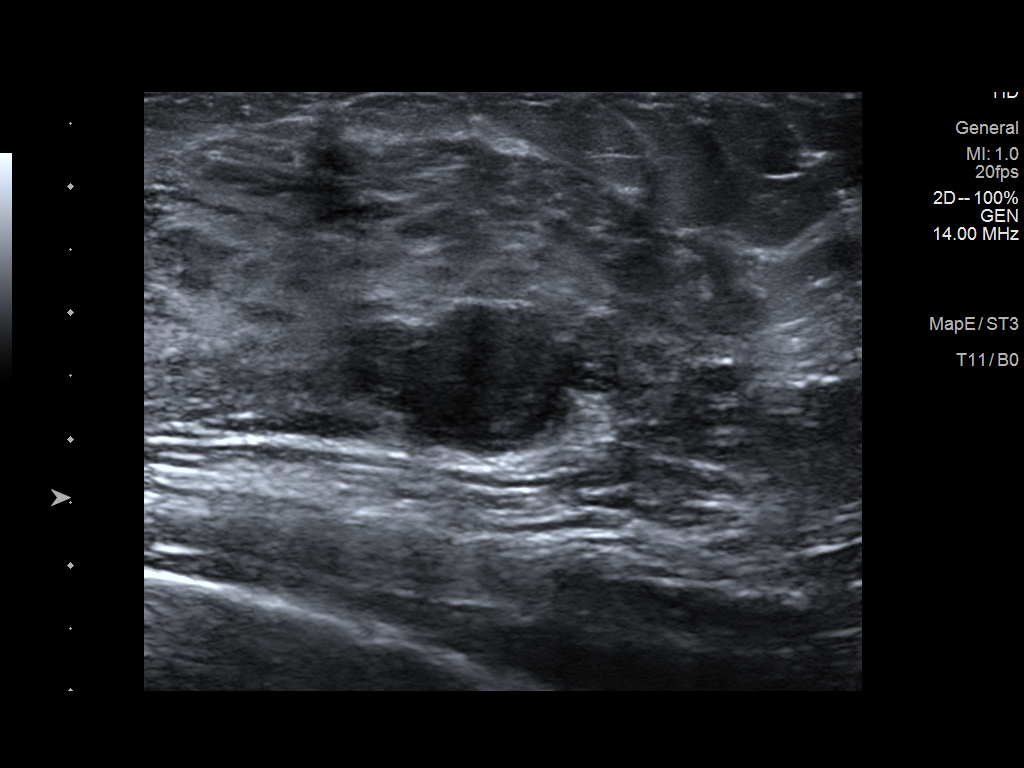
[im 2/5]
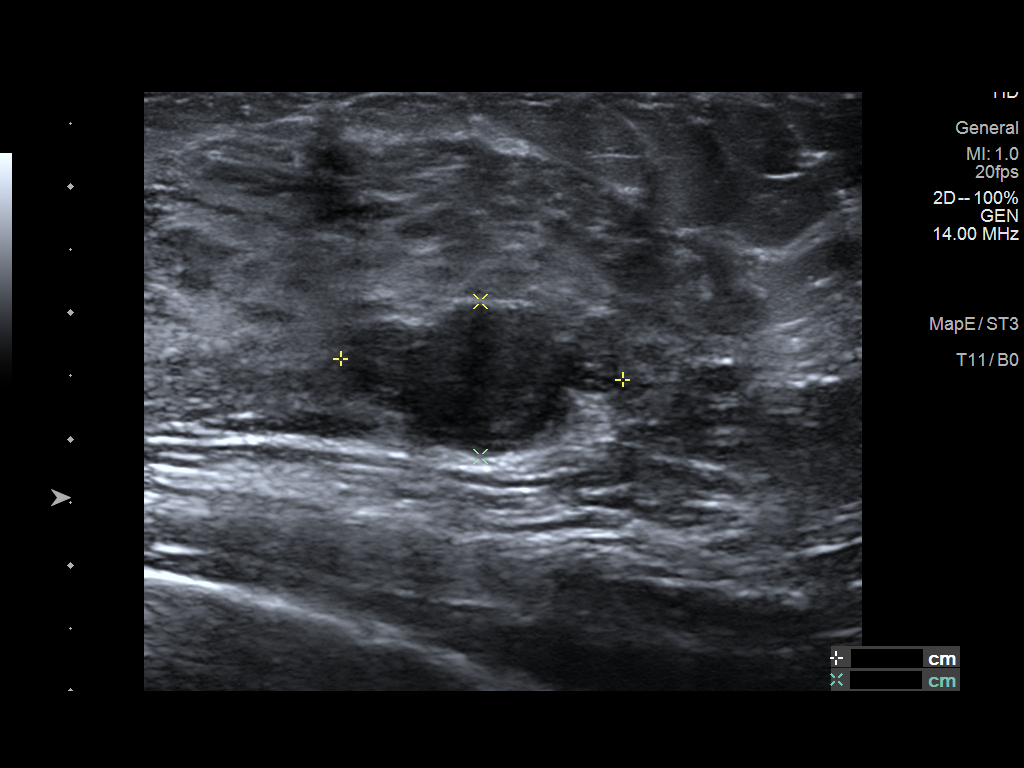
[im 3/5]
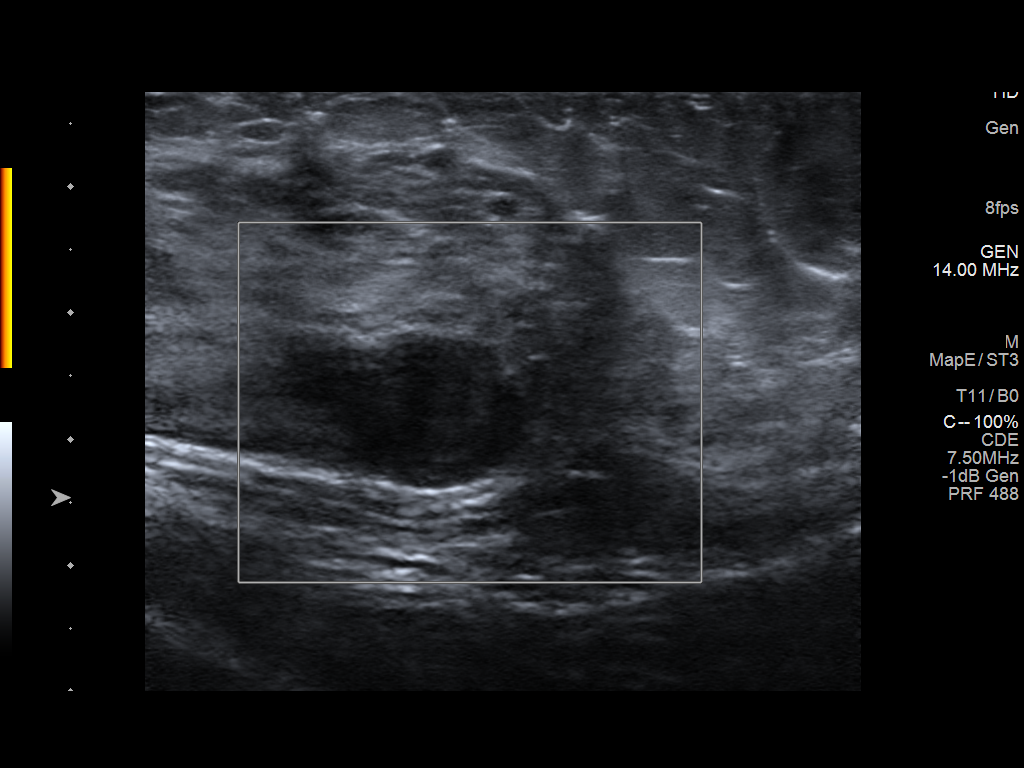
[im 4/5]
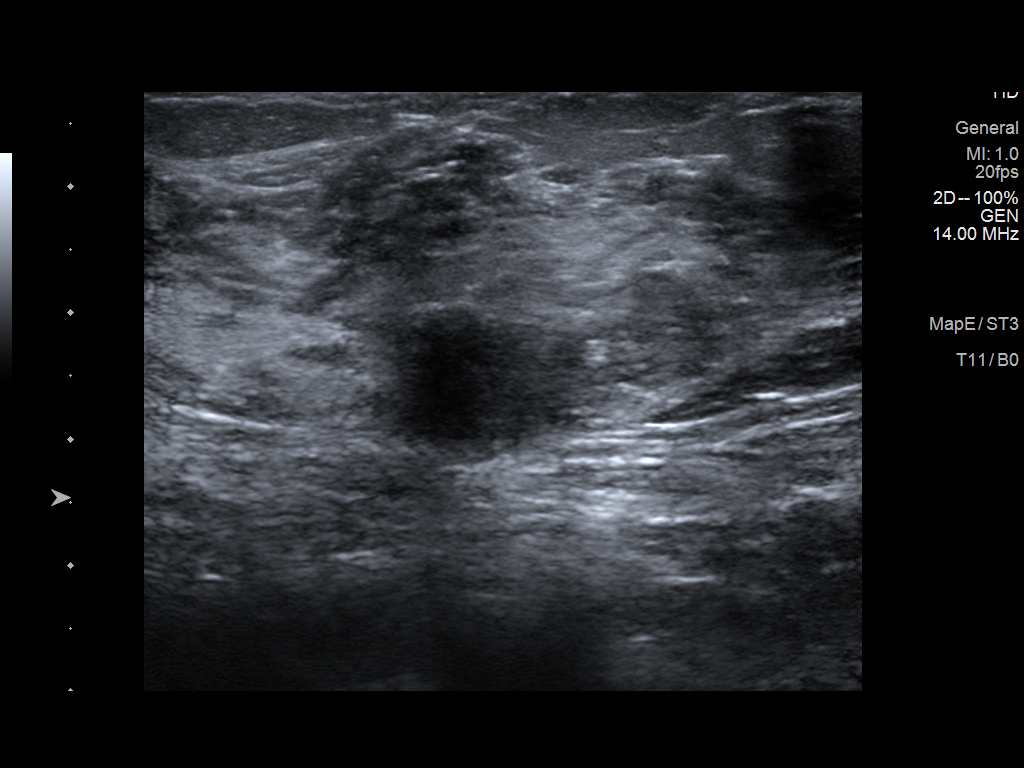
[im 5/5]
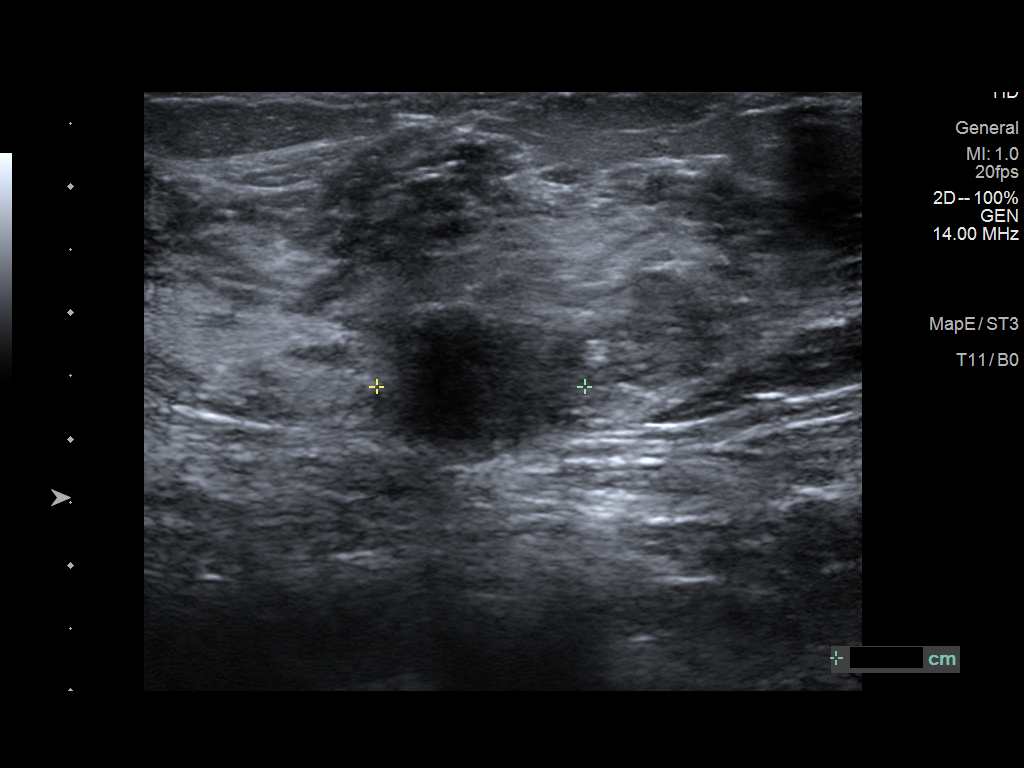

[5 of 5 positions shown; findings below may reference images not displayed]

FINDINGS: On correlative physical exam, the mass in the UPPER OUTER QUADRANT
the RIGHT breast is not palpable due to its depth.

Targeted ultrasound is performed, showing the previously identified
oval macrolobular parallel hypoechoic mass at the 10 o'clock
position 8 cm from the nipple at posterior depth, measuring
approximately 2.2 x 1.2 x 1.7 cm, demonstrating posterior acoustic
enhancement and no internal power Doppler flow.

On the previous exams, the mass was measured as 2 separate masses,
though on today's examination it appears to be a single macrolobular
mass. If measured as a single mass on the prior ultrasounds, the
previous measurements were approximately 2.5 x 1.1 x 1.9 cm on
06/25/2020 and 2.5 x 1.4 x 1.5 cm on 11/20/2019.
IMPRESSION: Stable likely benign mass in the UPPER OUTER QUADRANT of the RIGHT
breast, likely a fibroadenoma.

RECOMMENDATION:
RIGHT breast ultrasound in 1 year (in order to confirm 2 years of
stability of the likely benign mass).

I have discussed the findings and recommendations with the patient.
If applicable, a reminder letter will be sent to the patient
regarding the next appointment.

BI-RADS CATEGORY  3: Probably benign.

## 2023-11-03 ENCOUNTER — Encounter: Payer: Self-pay | Admitting: *Deleted

## 2023-11-07 ENCOUNTER — Ambulatory Visit: Admitting: Neurology

## 2023-11-07 ENCOUNTER — Telehealth: Payer: Self-pay | Admitting: Neurology

## 2023-11-07 ENCOUNTER — Encounter: Payer: Self-pay | Admitting: Neurology

## 2023-11-07 VITALS — BP 117/72 | HR 74 | Ht 60.0 in | Wt 194.0 lb

## 2023-11-07 DIAGNOSIS — R0683 Snoring: Secondary | ICD-10-CM

## 2023-11-07 DIAGNOSIS — Z9189 Other specified personal risk factors, not elsewhere classified: Secondary | ICD-10-CM

## 2023-11-07 DIAGNOSIS — R519 Headache, unspecified: Secondary | ICD-10-CM

## 2023-11-07 DIAGNOSIS — Z820 Family history of epilepsy and other diseases of the nervous system: Secondary | ICD-10-CM

## 2023-11-07 DIAGNOSIS — E669 Obesity, unspecified: Secondary | ICD-10-CM

## 2023-11-07 DIAGNOSIS — G43019 Migraine without aura, intractable, without status migrainosus: Secondary | ICD-10-CM | POA: Diagnosis not present

## 2023-11-07 MED ORDER — RIZATRIPTAN BENZOATE 5 MG PO TABS: 5.0000 mg | ORAL_TABLET | ORAL | 3 refills | Status: AC | PRN

## 2023-11-07 NOTE — Telephone Encounter (Signed)
 Noted, thank you. I will document in my office note.

## 2023-11-07 NOTE — Telephone Encounter (Signed)
 Pt was seen today by Dr. Buck. Came back in with a list of meds that she has taken before that did not work for her headaches. Baclofen 10 mg, Naratriptan 2/5 mg and Zonisamide 50 mg. She said dr wanted this list

## 2023-11-07 NOTE — Patient Instructions (Signed)
 It was nice to meet you today.   As discussed, your headaches are likely due to a combination of factors.   Here is what we discussed today and my recommendations for you:   Please remember, common headache triggers are: sleep deprivation, dehydration, overheating, stress, hypoglycemia or skipping meals and blood sugar fluctuations, excessive pain medications or excessive alcohol use or caffeine withdrawal. Some people have food triggers such as aged cheese, orange juice or chocolate, especially dark chocolate, or MSG (monosodium glutamate). Try to avoid these headache triggers as much possible. It may be helpful to keep a headache diary to figure out what makes your headaches worse or brings them on and what alleviates them. Some people report headache onset after exercise but studies have shown that regular exercise may actually prevent headaches from coming. If you have exercise-induced headaches, please make sure that you drink plenty of fluid before and after exercising and that you do not over do it and do not overheat. Please reduce and eliminate daily or frequent over the counter pain medications and medication overuse can perpetuate headaches.  We will do a brain scan, called MRI and call you with the test results. We will have to schedule you for this on a separate date. This test requires authorization from your insurance, and we will take care of the insurance process. I will order a sleep study to look for signs of obstructive sleep apnea (aka OSA). As explained, the long-term risks and ramifications of untreated moderate to severe obstructive sleep apnea may include (but are not limited to): increased risk for cardiovascular disease, including congestive heart failure, stroke, difficult to control hypertension, treatment resistant obesity, arrhythmias, especially irregular heartbeat commonly known as A. Fib. (atrial fibrillation); even type 2 diabetes has been linked to untreated OSA.  For  headache prevention, continue with the Nurtec per PCP and you can consider increasing the cyclobenzaprine dose.  We will start Maxalt  5 mg strength as needed: take 1 pill early on when you suspect a migraine attack come on. You may take another pill after 2 hours, no more than 2 pills in 24 hours. Most people who take triptans do not have any serious side-effects. However, they can cause drowsiness (remember to not drive or use heavy machinery when drowsy), nausea, dizziness, dry mouth. Less common side effects include strange sensations, such as tightness in your chest or throat, tingling, flushing, and feelings of heaviness or pressure in areas such as the face, limbs, and chest. These in the chest can mimic heart related pain (angina) and may cause alarm, but usually these sensations are not harmful or a sign of a heart attack. However, if you develop intense chest pain or sensations of discomfort, you should stop taking your medication and consult with me or your PCP or go to the nearest urgent care facility or ER or call 911.  Please [facility] to get [facility] so we can review, what medications you have tried and failed in the past, so not to retry something that did not work out before.  We will plan a follow up in about 6 months with the nurse practitioner.

## 2023-11-07 NOTE — Progress Notes (Addendum)
 Subjective:    Patient ID: Abigail Salas is a 28 y.o. female.  HPI    True Mar, MD, PhD [facility] [address] [address] [place], [place] [address]  Dear Dr. Claudene,  I saw your patient, Abigail Salas, upon your kind request in my neurologic clinic today for initial consultation of her recurrent headaches, concern for migraines.  The patient is unaccompanied today.  As you know, Abigail Salas is a 28 year old female with an underlying medical history of vitamin D deficiency, prior smoking, breast fibroadenoma, and obesity, who reports a longstanding history of migraines since fifth grade.  She is not aware of a family history of migraine headaches.  [facility] and get [facility] but stopped going last year.  She reports that she tried medications through the [facility] but does not recall all their names.  She does recall trying sumatriptan hand and something that starts with a T, possibly topiramate.  I reviewed your office note from 07/01/2023.  She has been on Nurtec every other day for prevention.  She has been on cyclobenzaprine for as needed use and has been on ondansetron  as needed since May 2025. She reports that she takes Flexeril as needed, maybe once a week, it does make her sedated.  She has tolerated Nurtec but still gets about 4-6 migraines a month, lasting anywhere from 24 to 48 hours.  She has associated light sensitivity and sound sensitivity and nausea.  She snores, she has never had a sleep study, she often wakes up with a headache.  She does not drink caffeine daily.  She tries to hydrate well with water, she estimates that she drinks about 3 40 ounce servings of water per day.  She does not drink any alcohol.  She goes to bed between 8:30 PM and 9 PM and rise time is at 4 AM.  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  Her Epworth sleepiness score is 5 out of 24, fatigue severity score  is 16 out of 63.  She is single, no children, she lives with her father, stepmom and stepsiblings. She denies any sudden onset of one-sided weakness or numbness or tingling or droopy face or slurring of speech.  She has not had a brain scan such as MRI or CT scan of the head.  She would be interested in getting a scan done.  She has been up-to-date with her eye examination, she [facility].  Addendum, 11/07/2023: The patient called back after completion of the appointment and reported that she had taken baclofen, naratriptan and zonisamide in the past.   Her Past Medical History Is Significant For: 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   Migraines     Her Past Surgical History Is Significant For: Past Surgical History:  Procedure Laterality Date   acl, meniscus, IT band R knee     BREAST EXCISIONAL BIOPSY Left 04/04/2017   Fibroadenoma   BREAST LUMPECTOMY Left 04/04/2017   Procedure: EXCISION OF LEFT BREAST FIBROADENOMA;  Surgeon: Gail Favorite, MD;  Location: [facility];  Service: General;  Laterality: Left;   KNEE ARTHROSCOPY W/ ACL RECONSTRUCTION Right    and meniscus; 2 times    Her Family History Is Significant For: Family History  Problem Relation Age of Onset   Healthy Mother  Healthy Father    Cancer Paternal Grandfather    Breast cancer Maternal Aunt    Cancer Maternal Aunt    Migraines Neg Hx     Her Social History Is Significant For: Social History   Socioeconomic History   Marital status: Single    Spouse name: Not on file   Number of children: 0   Years of education: Not on file   Highest education level: Not on file  Occupational History   Not on file  Tobacco Use   Smoking status: Former   Smokeless tobacco: Never  Vaping Use   Vaping status: Never Used  Substance and Sexual Activity   Alcohol use: Yes    Comment: I barely  drink, not on weekly basis   Drug use: No   Sexual activity: Not on file  Other Topics Concern   Not on file  Social History Narrative   Lives with dad   Right handed   Caffeine: none usually, seldom 1    Social Drivers of Corporate investment banker Strain: Not on file  Food Insecurity: Not on file  Transportation Needs: Not on file  Physical Activity: Not on file  Stress: Not on file  Social Connections: Not on file    Her Allergies Are:  No Known Allergies:   Her Current Medications Are:  Outpatient Encounter Medications as of 11/07/2023  Medication Sig   ascorbic acid (VITAMIN C) 500 MG tablet Take 500 mg by mouth daily.   cetirizine  (ZYRTEC  ALLERGY) 10 MG tablet Take 1 tablet (10 mg total) by mouth at bedtime.   cholecalciferol (VITAMIN D3) 25 MCG (1000 UNIT) tablet Take 1,000 Units by mouth daily.   cyclobenzaprine (FLEXERIL) 5 MG tablet Take 5 mg by mouth daily as needed.   fluticasone  (FLONASE ) 50 MCG/ACT nasal spray Place 1 spray into both nostrils daily.   Ibuprofen  200 MG CAPS 1 capsule.   Levonorgestrel (SKYLA) 13.5 MG IUD Take 1 device by intrauterine route.   Multiple Vitamin (MULTI VITAMIN) TABS 1 tablet Orally Once a day   Rimegepant Sulfate (NURTEC) 75 MG TBDP TAKE ONE TABLET BY MOUTH AND DISSOLVE EVERY OTHER DAY   HYDROcodone -acetaminophen  (NORCO/VICODIN) 5-325 MG tablet Take 1 tablet by mouth every 6 (six) hours as needed (pain). (Patient not taking: Reported on 11/07/2023)   ketorolac  (TORADOL ) 10 MG tablet Take 1 tablet (10 mg total) by mouth every 6 (six) hours as needed (pain). (Patient not taking: Reported on 11/07/2023)   Multiple Vitamin (MULTIVITAMIN WITH MINERALS) TABS tablet Take 1 tablet by mouth daily.   No facility-administered encounter medications on file as of 11/07/2023.  :   Review of Systems:  Out of a complete 14 point review of systems, all are reviewed and negative with the exception of these symptoms as listed  below:          Review of Systems  Neurological:        Patient is here alone for referral from primary care for migraines. Takes Nurtec every other day and it does not help. Also takes Ibuprofen .     Objective:  Neurological Exam  Physical Exam Physical Examination:   Vitals:   11/07/23 1451  BP: 117/72  Pulse: 74    General Examination: The patient is a very pleasant 28 y.o. female in no acute distress. She appears well-developed and well-nourished and well groomed.   HEENT: Normocephalic, atraumatic, pupils are equal, round and reactive to light, extraocular tracking is good without limitation to  gaze excursion or nystagmus noted.  No photophobia, funduscopic exam benign.  Corrective eyeglasses in place, which we removed for funduscopic exam.  Hearing is grossly intact. Face is symmetric with normal facial animation and normal facial sensation to light touch, temperature and vibration sense. Speech is clear with no dysarthria noted. There is no hypophonia. There is no lip, neck/head, jaw or voice tremor. Neck is supple with full range of passive and active motion. There are no carotid bruits on auscultation. Oropharynx exam reveals: mild mouth dryness, adequate dental hygiene and moderate airway crowding secondary to tonsillar size of about 2+, small airway entry, Mallampati class II, neck circumference 14-5/8 inches.  Tongue protrudes centrally and palate elevates symmetrically.   Chest: Clear to auscultation without wheezing, rhonchi or crackles noted.  Heart: S1+S2+0, regular and normal without murmurs, rubs or gallops noted.   Abdomen: Soft, non-tender and non-distended.  Extremities: There is no pitting edema in the distal lower extremities bilaterally.   Skin: Warm and dry without trophic changes noted.   Musculoskeletal: exam reveals no obvious joint deformities.   Neurologically:  Mental status: The patient is awake, alert and oriented in all 4 spheres. Her  immediate and remote memory, attention, language skills and fund of knowledge are appropriate. There is no evidence of aphasia, agnosia, apraxia or anomia. Speech is clear with normal prosody and enunciation. Thought process is linear. Mood is normal and affect is normal.  Cranial nerves II - XII are as described above under HEENT exam.  Motor exam: Normal bulk, strength and tone is noted. There is no obvious action or resting tremor.  No drift or rebound, no postural or intention tremor. Fine motor skills and coordination: intact finger taps, hand movements and rapid alternating patterning with both upper extremities, normal foot taps bilaterally in the lower extremities.  Romberg negative. Reflexes 1+ throughout, toes are downgoing bilaterally. Cerebellar testing: No dysmetria or intention tremor. There is no truncal or gait ataxia.  Normal finger-to-nose, normal heel-to-shin bilaterally. Sensory exam: intact to light touch, temperature and vibration sense in the upper and lower extremities.  Gait, station and balance: She stands easily. No veering to one side is noted. No leaning to one side is noted. Posture is age-appropriate and stance is narrow based. Gait shows normal stride length and normal pace. No problems turning are noted.  Normal tandem walk.  Assessment and Plan:  In summary, Mizani Dilday Berroa is a very pleasant 28 y.o.-year old female with an underlying medical history of vitamin D deficiency, prior smoking, breast fibroadenoma, and obesity, who presents for evaluation of her migraine headaches of several years duration with multiple medications tried in the past, currently on Nurtec, cyclobenzaprine, and ondansetron .  She previously tried zonisamide and naratriptan, had trigger point injections from what I understand through the [facility] and also tried baclofen.  She also recalls trying sumatriptan.  Neurological exam is nonfocal and reassuring.  She is up-to-date with her  eye examination.  Nevertheless, I would like to rule out a structural cause of her headaches by doing a brain MRI with and without contrast.  In addition, she may be at risk for obstructive sleep apnea and I would like to proceed with a sleep study.  I had a long discussion with the patient today.   This was an extended visit of over 60 minutes with copious record review involved, addressing multiple problems, and considerable counseling and coordination of care.   Below is a summary of my recommendations and  our discussion points from today's visit, based on chart review, history and examination. They were given these instructions verbally during the visit in detail and also in writing in the MyChart after visit summary (AVS), which they can access electronically.   <<  Please remember, common headache triggers are: sleep deprivation, dehydration, overheating, stress, hypoglycemia or skipping meals and blood sugar fluctuations, excessive pain medications or excessive alcohol use or caffeine withdrawal. Some people have food triggers such as aged cheese, orange juice or chocolate, especially dark chocolate, or MSG (monosodium glutamate). Try to avoid these headache triggers as much possible. It may be helpful to keep a headache diary to figure out what makes your headaches worse or brings them on and what alleviates them. Some people report headache onset after exercise but studies have shown that regular exercise may actually prevent headaches from coming. If you have exercise-induced headaches, please make sure that you drink plenty of fluid before and after exercising and that you do not over do it and do not overheat. Please reduce and eliminate daily or frequent over the counter pain medications and medication overuse can perpetuate headaches.  We will do a brain scan, called MRI and call you with the test results. We will have to schedule you for this on a separate date. This test requires  authorization from your insurance, and we will take care of the insurance process. I will order a sleep study to look for signs of obstructive sleep apnea (aka OSA). As explained, the long-term risks and ramifications of untreated moderate to severe obstructive sleep apnea may include (but are not limited to): increased risk for cardiovascular disease, including congestive heart failure, stroke, difficult to control hypertension, treatment resistant obesity, arrhythmias, especially irregular heartbeat commonly known as A. Fib. (atrial fibrillation); even type 2 diabetes has been linked to untreated OSA.  For headache prevention, continue with the Nurtec per PCP and you can consider increasing the cyclobenzaprine dose.  We will start Maxalt  5 mg strength as needed: take 1 pill early on when you suspect a migraine attack come on. You may take another pill after 2 hours, no more than 2 pills in 24 hours. Most people who take triptans do not have any serious side-effects. However, they can cause drowsiness (remember to not drive or use heavy machinery when drowsy), nausea, dizziness, dry mouth. Less common side effects include strange sensations, such as tightness in your chest or throat, tingling, flushing, and feelings of heaviness or pressure in areas such as the face, limbs, and chest. These in the chest can mimic heart related pain (angina) and may cause alarm, but usually these sensations are not harmful or a sign of a heart attack. However, if you develop intense chest pain or sensations of discomfort, you should stop taking your medication and consult with me or your PCP or go to the nearest urgent care facility or ER or call 911.  Please try to get records from the [facility] so we can review, what medications you have tried and failed in the past, so not to retry something that did not work out before.  We will plan a follow up in about 6 months with the nurse practitioner.   >>      Thank you very much for allowing me to participate in the care of this nice patient. If I can be of any further assistance to you please do not hesitate to call me at [phone].  Sincerely,   True Mar,  MD, PhD

## 2023-11-08 ENCOUNTER — Ambulatory Visit

## 2023-11-08 DIAGNOSIS — G43019 Migraine without aura, intractable, without status migrainosus: Secondary | ICD-10-CM | POA: Diagnosis not present

## 2023-11-08 DIAGNOSIS — Z820 Family history of epilepsy and other diseases of the nervous system: Secondary | ICD-10-CM | POA: Diagnosis not present

## 2023-11-08 DIAGNOSIS — R0683 Snoring: Secondary | ICD-10-CM | POA: Diagnosis not present

## 2023-11-08 DIAGNOSIS — Z9189 Other specified personal risk factors, not elsewhere classified: Secondary | ICD-10-CM | POA: Diagnosis not present

## 2023-11-08 DIAGNOSIS — R519 Headache, unspecified: Secondary | ICD-10-CM

## 2023-11-08 DIAGNOSIS — E669 Obesity, unspecified: Secondary | ICD-10-CM

## 2023-11-08 MED ORDER — GADOBENATE DIMEGLUMINE 529 MG/ML IV SOLN
18.0000 mL | Freq: Once | INTRAVENOUS | Status: AC | PRN
Start: 2023-11-08 — End: 2023-11-08
  Administered 2023-11-08: 18 mL via INTRAVENOUS

## 2023-11-14 ENCOUNTER — Ambulatory Visit: Payer: Self-pay | Admitting: *Deleted

## 2023-11-16 ENCOUNTER — Telehealth: Payer: Self-pay | Admitting: Neurology

## 2023-11-16 NOTE — Telephone Encounter (Signed)
 I spoke with the patient.  She is scheduled at [facility] for 12/04/23 at 9 pm.  Mailed packet and sent mychart

## 2023-12-04 ENCOUNTER — Ambulatory Visit: Admitting: Neurology

## 2023-12-04 DIAGNOSIS — R0683 Snoring: Secondary | ICD-10-CM

## 2023-12-04 DIAGNOSIS — G4720 Circadian rhythm sleep disorder, unspecified type: Secondary | ICD-10-CM

## 2023-12-04 DIAGNOSIS — E669 Obesity, unspecified: Secondary | ICD-10-CM

## 2023-12-04 DIAGNOSIS — Z9189 Other specified personal risk factors, not elsewhere classified: Secondary | ICD-10-CM

## 2023-12-04 DIAGNOSIS — G43019 Migraine without aura, intractable, without status migrainosus: Secondary | ICD-10-CM

## 2023-12-04 DIAGNOSIS — Z820 Family history of epilepsy and other diseases of the nervous system: Secondary | ICD-10-CM

## 2023-12-04 DIAGNOSIS — R519 Headache, unspecified: Secondary | ICD-10-CM

## 2023-12-12 NOTE — Procedures (Signed)
 Physician Interpretation: Please see link under Procedure Tab or under Encounters tab for physician report, technical report, as well as O2 titration and/or PAP titration tables (if applicable).

## 2023-12-26 ENCOUNTER — Telehealth: Payer: Self-pay | Admitting: Neurology

## 2023-12-26 NOTE — Telephone Encounter (Signed)
 Called pt back and LVM (ok per DPR) asking for call back or reply to [facility] message so we can get more information from her about the Maxalt  not working.   Sent fpl group.

## 2023-12-26 NOTE — Telephone Encounter (Signed)
 Pt states she has had a headache/migraine since last Thursday. Thursday she vomited 4 times which is new for her. Friday and Saturday she had a headache. Sunday and today have been a migraine, was dizzy yesterday. Maxalt  is not helping at all. Has been sleeping a lot. Has had to leave work early. Nurtec hasn't helped. I recommended the patient go to urgent care or even ER for evaluation and treatment given her persistent symptoms and the new vomiting that she has had. I told her I would let Dr Buck know and relay her comments back to patient. The patient verbalized understanding and appreciation.

## 2023-12-26 NOTE — Telephone Encounter (Signed)
 I agree with recommendations to go to UC or ED.

## 2023-12-26 NOTE — Telephone Encounter (Signed)
 Spoke with patient and relayed recommendation from Dr Buck to go to [facility] or ED. Pt verbalized understanding and appreciation for the call.

## 2023-12-26 NOTE — Telephone Encounter (Signed)
 Pt reports the rizatriptan  (MAXALT ) 5 MG tablet has not helped her at all, she would like a call from RN to discuss.

## 2024-06-06 ENCOUNTER — Ambulatory Visit: Admitting: Adult Health
